# Patient Record
Sex: Male | Born: 1985 | Race: White | Hispanic: No | Marital: Single | Smoking: Light tobacco smoker
Health system: Southern US, Community
[De-identification: ages and names within clinical notes are randomized; demographics above are authoritative.]

## PROBLEM LIST (undated history)

## (undated) DIAGNOSIS — K5792 Diverticulitis of intestine, part unspecified, without perforation or abscess without bleeding: Secondary | ICD-10-CM

## (undated) DIAGNOSIS — H9319 Tinnitus, unspecified ear: Secondary | ICD-10-CM

## (undated) HISTORY — DX: Tinnitus, unspecified ear: H93.19

## (undated) HISTORY — DX: Diverticulitis of intestine, part unspecified, without perforation or abscess without bleeding: K57.92

---
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
## 2007-08-26 HISTORY — PX: HERNIA REPAIR: SHX51

## 2018-02-16 DIAGNOSIS — M7042 Prepatellar bursitis, left knee: Secondary | ICD-10-CM | POA: Insufficient documentation

## 2018-09-16 DIAGNOSIS — M545 Low back pain, unspecified: Secondary | ICD-10-CM | POA: Insufficient documentation

## 2019-02-28 ENCOUNTER — Other Ambulatory Visit: Payer: Self-pay

## 2019-02-28 ENCOUNTER — Ambulatory Visit: Payer: Self-pay

## 2019-02-28 DIAGNOSIS — Z021 Encounter for pre-employment examination: Secondary | ICD-10-CM

## 2019-03-08 ENCOUNTER — Encounter: Payer: Self-pay | Admitting: Internal Medicine

## 2019-03-16 ENCOUNTER — Encounter: Payer: Self-pay | Admitting: Internal Medicine

## 2019-03-16 ENCOUNTER — Ambulatory Visit: Payer: Self-pay | Admitting: Internal Medicine

## 2019-03-16 ENCOUNTER — Other Ambulatory Visit: Payer: Self-pay

## 2019-03-16 VITALS — BP 132/84 | HR 74 | Temp 98.8°F | Resp 16 | Ht 70.5 in | Wt 205.0 lb

## 2019-03-16 DIAGNOSIS — Z021 Encounter for pre-employment examination: Secondary | ICD-10-CM

## 2019-03-16 DIAGNOSIS — Z9889 Other specified postprocedural states: Secondary | ICD-10-CM | POA: Insufficient documentation

## 2019-03-16 DIAGNOSIS — Z0289 Encounter for other administrative examinations: Secondary | ICD-10-CM | POA: Insufficient documentation

## 2019-03-16 DIAGNOSIS — Z8719 Personal history of other diseases of the digestive system: Secondary | ICD-10-CM | POA: Insufficient documentation

## 2019-03-16 LAB — POCT URINALYSIS DIPSTICK
Bilirubin, UA: NEGATIVE
Blood, UA: NEGATIVE
Glucose, UA: NEGATIVE
Ketones, UA: NEGATIVE
Leukocytes, UA: NEGATIVE
Nitrite, UA: NEGATIVE
Protein, UA: NEGATIVE
Spec Grav, UA: >=1.03 — AB (ref 1.010–1.025)
Urobilinogen, UA: 0.2 E.U./dL
pH, UA: 6 (ref 5.0–8.0)

## 2019-03-16 NOTE — Progress Notes (Signed)
S  - presents for BLET evaluation  Noted two loose BM's this am, he thinks from hummus had recently, no other Covid concerning sx's with no fevers, and no loss of taste/smell included and no abdominal pains, no N/V  No other specific complaints, denies any recent CP, palpitations, SOB, dark/black stools, recent fevers, or other concerning sx's. Had bilat ing hernias in his past and repaired, no return of any sx's of concern  Exercise - more regular exercise in last two weeks, and has been active prior  Meds reviewed No current outpatient medications on file prior to visit.   No current facility-administered medications on file prior to visit.     \No Known Allergies   Social History   Tobacco Use  Smoking Status Light Tobacco Smoker  . Types: Cigarettes  Smokeless Tobacco Former Systems developer  . Types: Snuff     O - NAD, masked BP 132/84 (BP Location: Left Arm, Patient Position: Sitting, Cuff Size: Large)   Pulse 74   Temp 98.8 F (37.1 C) (Oral)   Resp 16   Ht 5' 10.5" (1.791 m)   Wt 205 lb (93 kg)   SpO2 96%   BMI 29.00 kg/m  HEENT - sclera anicteric, PERRL, EOMI, conj - non-inj'ed, TM's and canals clear Neck - supple, no adenopathy, no TM Car - RRR without m/g/r Pulm- CTA without wheeze or rales Abd - soft, NT, ND, no obvious HSM,  Back - no CVA tenderness Skin- + tattoo RUE Ext - no LE edema, no active joints GU - no swelling in inguinal/suprapubic region, NT, no ing hernia on exam Neuro - affect was not flat, appropriate with conversation  Grossly non-focal with good strength on testing, sensation intact to LT in distal extremities, Romberg neg, no pronator drift, good balance on one foot, good finger to nose, good RAMs  Labs reviewed - Urine dip neg, urine drug screen neg, other labs pending Hearing and vision screens reviewed - no concerns  A/P - Forms completed for BLET physical, no concerns noted  F/u Friday planned for TB screen reading of skin test  If diarrhea  increases or other sx's of concern arise as noted, especially if raise concern for Covid, should f/u

## 2019-03-17 LAB — CMP12+LP+TP+TSH+6AC+CBC/D/PLT
ALT: 37 IU/L (ref 0–44)
AST: 41 IU/L — ABNORMAL HIGH (ref 0–40)
Albumin/Globulin Ratio: 1.8 (ref 1.2–2.2)
Albumin: 4.3 g/dL (ref 4.0–5.0)
Alkaline Phosphatase: 68 IU/L (ref 39–117)
BUN/Creatinine Ratio: 10 (ref 9–20)
BUN: 12 mg/dL (ref 6–20)
Basophils Absolute: 0 10*3/uL (ref 0.0–0.2)
Basos: 1 %
Bilirubin Total: 0.3 mg/dL (ref 0.0–1.2)
Calcium: 9.5 mg/dL (ref 8.7–10.2)
Chloride: 100 mmol/L (ref 96–106)
Chol/HDL Ratio: 4.2 ratio (ref 0.0–5.0)
Cholesterol, Total: 193 mg/dL (ref 100–199)
Creatinine, Ser: 1.25 mg/dL (ref 0.76–1.27)
EOS (ABSOLUTE): 0.1 10*3/uL (ref 0.0–0.4)
Eos: 2 %
Estimated CHD Risk: 0.8 times avg. (ref 0.0–1.0)
Free Thyroxine Index: 2 (ref 1.2–4.9)
GFR calc Af Amer: 87 mL/min/{1.73_m2} (ref >59)
GFR calc non Af Amer: 75 mL/min/{1.73_m2} (ref >59)
GGT: 16 IU/L (ref 0–65)
Globulin, Total: 2.4 g/dL (ref 1.5–4.5)
Glucose: 85 mg/dL (ref 65–99)
HDL: 46 mg/dL (ref >39)
Hematocrit: 40.8 % (ref 37.5–51.0)
Hemoglobin: 13.9 g/dL (ref 13.0–17.7)
Immature Grans (Abs): 0 10*3/uL (ref 0.0–0.1)
Immature Granulocytes: 0 %
Iron: 78 ug/dL (ref 38–169)
LDH: 204 IU/L (ref 121–224)
LDL Calculated: 114 mg/dL — ABNORMAL HIGH (ref 0–99)
Lymphocytes Absolute: 1.7 10*3/uL (ref 0.7–3.1)
Lymphs: 35 %
MCH: 29.1 pg (ref 26.6–33.0)
MCHC: 34.1 g/dL (ref 31.5–35.7)
MCV: 86 fL (ref 79–97)
Monocytes Absolute: 0.5 10*3/uL (ref 0.1–0.9)
Monocytes: 10 %
Neutrophils Absolute: 2.5 10*3/uL (ref 1.4–7.0)
Neutrophils: 52 %
Phosphorus: 3.4 mg/dL (ref 2.8–4.1)
Platelets: 304 10*3/uL (ref 150–450)
Potassium: 4.1 mmol/L (ref 3.5–5.2)
RBC: 4.77 x10E6/uL (ref 4.14–5.80)
RDW: 12.9 % (ref 11.6–15.4)
Sodium: 140 mmol/L (ref 134–144)
T3 Uptake Ratio: 29 % (ref 24–39)
T4, Total: 6.9 ug/dL (ref 4.5–12.0)
TSH: 1.22 u[IU]/mL (ref 0.450–4.500)
Total Protein: 6.7 g/dL (ref 6.0–8.5)
Triglycerides: 167 mg/dL — ABNORMAL HIGH (ref 0–149)
Uric Acid: 5.7 mg/dL (ref 3.7–8.6)
VLDL Cholesterol Cal: 33 mg/dL (ref 5–40)
WBC: 4.8 10*3/uL (ref 3.4–10.8)

## 2019-03-17 LAB — HEPATITIS B SURFACE ANTIBODY,QUALITATIVE: Hep B Surface Ab, Qual: REACTIVE

## 2019-03-18 LAB — TB SKIN TEST
Induration: 0 mm
TB Skin Test: NEGATIVE

## 2019-08-04 DIAGNOSIS — Z20828 Contact with and (suspected) exposure to other viral communicable diseases: Secondary | ICD-10-CM | POA: Diagnosis not present

## 2020-01-17 ENCOUNTER — Other Ambulatory Visit: Payer: Self-pay

## 2020-01-17 ENCOUNTER — Ambulatory Visit: Payer: Self-pay

## 2020-01-17 DIAGNOSIS — Z0000 Encounter for general adult medical examination without abnormal findings: Secondary | ICD-10-CM

## 2020-01-17 LAB — POCT URINALYSIS DIPSTICK
Bilirubin, UA: NEGATIVE
Blood, UA: NEGATIVE
Glucose, UA: NEGATIVE
Ketones, UA: NEGATIVE
Leukocytes, UA: NEGATIVE
Nitrite, UA: NEGATIVE
Protein, UA: NEGATIVE
Spec Grav, UA: >=1.03 — AB (ref 1.010–1.025)
Urobilinogen, UA: NEGATIVE E.U./dL — AB
pH, UA: 6 (ref 5.0–8.0)

## 2020-01-18 LAB — CMP12+LP+TP+TSH+6AC+CBC/D/PLT
ALT: 26 IU/L (ref 0–44)
AST: 21 IU/L (ref 0–40)
Albumin/Globulin Ratio: 1.8 (ref 1.2–2.2)
Albumin: 4.4 g/dL (ref 4.0–5.0)
Alkaline Phosphatase: 76 IU/L (ref 48–121)
BUN/Creatinine Ratio: 14 (ref 9–20)
BUN: 15 mg/dL (ref 6–20)
Basophils Absolute: 0 10*3/uL (ref 0.0–0.2)
Basos: 1 %
Bilirubin Total: 0.2 mg/dL (ref 0.0–1.2)
Calcium: 9.6 mg/dL (ref 8.7–10.2)
Chloride: 102 mmol/L (ref 96–106)
Chol/HDL Ratio: 4.8 ratio (ref 0.0–5.0)
Cholesterol, Total: 223 mg/dL — ABNORMAL HIGH (ref 100–199)
Creatinine, Ser: 1.11 mg/dL (ref 0.76–1.27)
EOS (ABSOLUTE): 0.1 10*3/uL (ref 0.0–0.4)
Eos: 4 %
Estimated CHD Risk: 1 times avg. (ref 0.0–1.0)
Free Thyroxine Index: 1.4 (ref 1.2–4.9)
GFR calc Af Amer: 100 mL/min/{1.73_m2} (ref >59)
GFR calc non Af Amer: 86 mL/min/{1.73_m2} (ref >59)
GGT: 14 IU/L (ref 0–65)
Globulin, Total: 2.4 g/dL (ref 1.5–4.5)
Glucose: 81 mg/dL (ref 65–99)
HDL: 46 mg/dL (ref >39)
Hematocrit: 42.2 % (ref 37.5–51.0)
Hemoglobin: 14.7 g/dL (ref 13.0–17.7)
Immature Grans (Abs): 0 10*3/uL (ref 0.0–0.1)
Immature Granulocytes: 0 %
Iron: 40 ug/dL (ref 38–169)
LDH: 158 IU/L (ref 121–224)
LDL Chol Calc (NIH): 149 mg/dL — ABNORMAL HIGH (ref 0–99)
Lymphocytes Absolute: 1.3 10*3/uL (ref 0.7–3.1)
Lymphs: 40 %
MCH: 29.3 pg (ref 26.6–33.0)
MCHC: 34.8 g/dL (ref 31.5–35.7)
MCV: 84 fL (ref 79–97)
Monocytes Absolute: 0.3 10*3/uL (ref 0.1–0.9)
Monocytes: 10 %
Neutrophils Absolute: 1.5 10*3/uL (ref 1.4–7.0)
Neutrophils: 45 %
Phosphorus: 2.8 mg/dL (ref 2.8–4.1)
Platelets: 287 10*3/uL (ref 150–450)
Potassium: 4 mmol/L (ref 3.5–5.2)
RBC: 5.01 x10E6/uL (ref 4.14–5.80)
RDW: 13.4 % (ref 11.6–15.4)
Sodium: 139 mmol/L (ref 134–144)
T3 Uptake Ratio: 26 % (ref 24–39)
T4, Total: 5.5 ug/dL (ref 4.5–12.0)
TSH: 1.54 u[IU]/mL (ref 0.450–4.500)
Total Protein: 6.8 g/dL (ref 6.0–8.5)
Triglycerides: 154 mg/dL — ABNORMAL HIGH (ref 0–149)
Uric Acid: 4 mg/dL (ref 3.8–8.4)
VLDL Cholesterol Cal: 28 mg/dL (ref 5–40)
WBC: 3.2 10*3/uL — ABNORMAL LOW (ref 3.4–10.8)

## 2020-01-30 ENCOUNTER — Encounter: Payer: Self-pay | Admitting: Physician Assistant

## 2020-01-30 ENCOUNTER — Other Ambulatory Visit: Payer: Self-pay

## 2020-01-30 ENCOUNTER — Ambulatory Visit: Payer: Self-pay | Admitting: Physician Assistant

## 2020-01-30 VITALS — BP 146/88 | HR 81 | Temp 98.1°F | Resp 16 | Ht 71.0 in | Wt 197.8 lb

## 2020-01-30 DIAGNOSIS — Z0000 Encounter for general adult medical examination without abnormal findings: Secondary | ICD-10-CM

## 2020-01-30 NOTE — Progress Notes (Signed)
° °  Subjective: Physical exam    Patient ID: George Wong, male    DOB: Dec 26, 1985, 34 y.o.   MRN: [id number]  HPI Patient presents for annual physical exam.  Patient voices no concerns or complaints.   Review of Systems Negative    Objective:   Physical Exam No acute distress.  HEENT is unremarkable.  Neck is supple for adenopathy or bruits.  Lungs are clear to auscultation.  Heart regular rate and rhythm.  Abdomen with negative HSM, normoactive bowel sounds, soft, and nontender to palpation.  No obvious deformity to the upper or lower extremities.  Patient has full and equal range of motion of the upper and lower extremities.  No obvious deformity to the cervical lumbar spine.  Patient has full and equal range of motion of the cervical lumbar spine.  Cranial nerves II through XII are grossly intact.       Assessment & Plan: Well exam.  Discussed lab results with patient showing a cholesterol of 223, triglycerides 154, and LDL 149, and HDL of 46.  Discussed treatment options and patient Lasix to try lifestyle modification for 6 months.  Patient advised follow-up in 6 months with repeat labs.

## 2020-07-23 DIAGNOSIS — J069 Acute upper respiratory infection, unspecified: Secondary | ICD-10-CM | POA: Diagnosis not present

## 2020-07-23 DIAGNOSIS — Z03818 Encounter for observation for suspected exposure to other biological agents ruled out: Secondary | ICD-10-CM | POA: Diagnosis not present

## 2020-11-07 ENCOUNTER — Encounter: Payer: Self-pay | Admitting: Physician Assistant

## 2020-11-07 ENCOUNTER — Ambulatory Visit: Payer: Self-pay | Admitting: Physician Assistant

## 2020-11-07 ENCOUNTER — Other Ambulatory Visit: Payer: Self-pay

## 2020-11-07 VITALS — BP 132/88 | HR 77 | Temp 97.9°F | Resp 14 | Ht 71.0 in | Wt 195.0 lb

## 2020-11-07 DIAGNOSIS — Z4889 Encounter for other specified surgical aftercare: Secondary | ICD-10-CM | POA: Insufficient documentation

## 2020-11-07 DIAGNOSIS — R030 Elevated blood-pressure reading, without diagnosis of hypertension: Secondary | ICD-10-CM | POA: Insufficient documentation

## 2020-11-07 DIAGNOSIS — Z9889 Other specified postprocedural states: Secondary | ICD-10-CM | POA: Insufficient documentation

## 2020-11-07 DIAGNOSIS — F909 Attention-deficit hyperactivity disorder, unspecified type: Secondary | ICD-10-CM | POA: Insufficient documentation

## 2020-11-07 DIAGNOSIS — T148XXA Other injury of unspecified body region, initial encounter: Secondary | ICD-10-CM

## 2020-11-07 DIAGNOSIS — Z111 Encounter for screening for respiratory tuberculosis: Secondary | ICD-10-CM | POA: Insufficient documentation

## 2020-11-07 DIAGNOSIS — B360 Pityriasis versicolor: Secondary | ICD-10-CM | POA: Insufficient documentation

## 2020-11-07 DIAGNOSIS — H52229 Regular astigmatism, unspecified eye: Secondary | ICD-10-CM | POA: Insufficient documentation

## 2020-11-07 DIAGNOSIS — N469 Male infertility, unspecified: Secondary | ICD-10-CM | POA: Insufficient documentation

## 2020-11-07 DIAGNOSIS — F17200 Nicotine dependence, unspecified, uncomplicated: Secondary | ICD-10-CM | POA: Insufficient documentation

## 2020-11-07 DIAGNOSIS — H5210 Myopia, unspecified eye: Secondary | ICD-10-CM | POA: Insufficient documentation

## 2020-11-07 DIAGNOSIS — N4611 Organic oligospermia: Secondary | ICD-10-CM | POA: Insufficient documentation

## 2020-11-07 DIAGNOSIS — E291 Testicular hypofunction: Secondary | ICD-10-CM | POA: Insufficient documentation

## 2020-11-07 DIAGNOSIS — M2240 Chondromalacia patellae, unspecified knee: Secondary | ICD-10-CM | POA: Insufficient documentation

## 2020-11-07 DIAGNOSIS — K649 Unspecified hemorrhoids: Secondary | ICD-10-CM | POA: Insufficient documentation

## 2020-11-07 DIAGNOSIS — B354 Tinea corporis: Secondary | ICD-10-CM | POA: Insufficient documentation

## 2020-11-07 DIAGNOSIS — Z23 Encounter for immunization: Secondary | ICD-10-CM | POA: Insufficient documentation

## 2020-11-07 DIAGNOSIS — S93402A Sprain of unspecified ligament of left ankle, initial encounter: Secondary | ICD-10-CM | POA: Insufficient documentation
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
## 2020-11-14 ENCOUNTER — Other Ambulatory Visit: Payer: Self-pay

## 2020-11-14 ENCOUNTER — Emergency Department
Admission: EM | Admit: 2020-11-14 | Discharge: 2020-11-14 | Disposition: A | Discharge status: Home / Self Care | Payer: No Typology Code available for payment source | Attending: Emergency Medicine | Admitting: Emergency Medicine

## 2020-11-14 DIAGNOSIS — F17210 Nicotine dependence, cigarettes, uncomplicated: Secondary | ICD-10-CM | POA: Insufficient documentation

## 2020-11-14 DIAGNOSIS — F419 Anxiety disorder, unspecified: Secondary | ICD-10-CM | POA: Diagnosis present

## 2020-11-14 DIAGNOSIS — R457 State of emotional shock and stress, unspecified: Secondary | ICD-10-CM | POA: Diagnosis not present
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
## 2020-11-14 NOTE — Progress Notes (Addendum)
Lab corp specimen [id number]
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
## 2020-11-15 ENCOUNTER — Ambulatory Visit: Payer: Self-pay | Admitting: Nurse Practitioner

## 2020-11-15 ENCOUNTER — Encounter: Payer: Self-pay | Admitting: Nurse Practitioner

## 2020-11-15 VITALS — BP 120/87 | HR 71 | Temp 98.2°F | Resp 12

## 2020-11-15 DIAGNOSIS — Z09 Encounter for follow-up examination after completed treatment for conditions other than malignant neoplasm: Secondary | ICD-10-CM

## 2020-11-15 DIAGNOSIS — Z0130 Encounter for examination of blood pressure without abnormal findings: Secondary | ICD-10-CM
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
## 2020-11-15 NOTE — Progress Notes (Signed)
Last vaped today around 1:30 pm.

## 2020-11-15 NOTE — Patient Instructions (Addendum)
Contusion A contusion is a deep bruise. This is a result of an injury that causes bleeding under the skin. Symptoms of bruising include pain, swelling, and discolored skin. The skin may turn blue, purple, or yellow. Follow these instructions at home: Managing pain, stiffness, and swelling You may use RICE. This stands for:  Resting.  Icing.  Compression, or putting pressure.  Elevating, or raising the injured area. To follow this method, do these actions:  Rest the injured area.  If told, put ice on the injured area. ? Put ice in a plastic bag. ? Place a towel between your skin and the bag. ? Leave the ice on for 20 minutes, 2-3 times per day.  If told, put light pressure (compression) on the injured area using an elastic bandage. Make sure the bandage is not too tight. If the area tingles or becomes numb, remove it and put it back on as told by your doctor.  If possible, raise (elevate) the injured area above the level of your heart while you are sitting or lying down.   General instructions  Take over-the-counter and prescription medicines only as told by your doctor.  Keep all follow-up visits as told by your doctor. This is important. Contact a doctor if:  Your symptoms do not get better after several days of treatment.  Your symptoms get worse.  You have trouble moving the injured area. Get help right away if:  You have very bad pain.  You have a loss of feeling (numbness) in a hand or foot.  Your hand or foot turns pale or cold. Summary  A contusion is a deep bruise. This is a result of an injury that causes bleeding under the skin.  Symptoms of bruising include pain, swelling, and discolored skin. The skin may turn blue, purple, or yellow.  This condition is treated with rest, ice, compression, and elevation. This is also called RICE. You may be given over-the-counter medicines for pain.  Contact a doctor if you do not feel better, or you feel worse. Get  help right away if you have very bad pain, have lost feeling in a hand or foot, or the area turns pale or cold. This information is not intended to replace advice given to you by your health care provider. Make sure you discuss any questions you have with your health care provider. Document Revised: 04/02/2018 Document Reviewed: 04/02/2018 Elsevier Patient Education  2021 Elsevier Inc.  [email or web address]">  Panic Attack A panic attack is when you suddenly feel very afraid, uncomfortable, or nervous (anxious). A panic attack can happen when you are scared or for no reason. A panic attack can feel like a serious problem. It can even feel like a heart attack or stroke. See your doctor when you have a panic attack to make sure you do not have a serious problem. Follow these instructions at home:  Take medicines only as told by your doctor.  If you feel worried or nervous, try not to have caffeine.  Take good care of your health. To do this: ? Eat healthy. Make sure to eat fresh fruits and vegetables, whole grains, lean meats, and low-fat dairy. ? Get enough sleep. Try to sleep for 7-8 hours each night. ? Exercise. Try to be active for 30 minutes 5 or more days a week. ? Do not smoke. Talk to your doctor if you need help quitting. ? Limit how much alcohol you drink:  If you are a woman who is not pregnant:  try not to have more than 1 drink a day.  If you are a man: try not to have more than 2 drinks a day.  One drink equals 12 oz of beer, 5 oz of wine, or 1 oz of hard liquor.  Keep all follow-up visits as told by your doctor. This is important.   Contact a doctor if:  Your symptoms do not get better.  Your symptoms get worse.  You are not able to take your medicines as told. Get help right away if:  You have thoughts of hurting yourself or others.  You have symptoms of a panic attack. Do not drive yourself to the hospital. Have  someone else drive you or call an ambulance. If you feel like you may hurt yourself or others, or have thoughts about taking your own life, get help right away. You can go to your nearest emergency department or call:  Your local emergency services (911 in the [place]  A suicide crisis helpline, such as the National Suicide Prevention Lifeline at 860-856-2560. This is open 24 hours a day. Summary  A panic attack is when you suddenly feel very afraid, uncomfortable, or nervous (anxious).  See your doctor when you have a panic attack to make sure that you do not have another serious problem.  If you feel like you may hurt yourself or others, get help right away by calling 911. This information is not intended to replace advice given to you by your health care provider. Make sure you discuss any questions you have with your health care provider. Document Revised: 02/09/2020 Document Reviewed: 02/09/2020 Elsevier Patient Education  2021 [employer].

## 2020-12-13 ENCOUNTER — Ambulatory Visit: Payer: Self-pay

## 2020-12-13 ENCOUNTER — Other Ambulatory Visit: Payer: Self-pay

## 2020-12-13 DIAGNOSIS — Z01818 Encounter for other preprocedural examination: Secondary | ICD-10-CM

## 2020-12-13 LAB — POCT URINALYSIS DIPSTICK
Bilirubin, UA: NEGATIVE
Blood, UA: NEGATIVE
Glucose, UA: NEGATIVE
Ketones, UA: NEGATIVE
Leukocytes, UA: NEGATIVE
Nitrite, UA: NEGATIVE
Protein, UA: NEGATIVE
Spec Grav, UA: >=1.03 — AB (ref 1.010–1.025)
Urobilinogen, UA: 0.2 E.U./dL
pH, UA: 6 (ref 5.0–8.0)

## 2020-12-13 NOTE — Progress Notes (Signed)
Scheduled to complete physical 12/21/20 with Adam Scarboro, NP.  AMD 

## 2020-12-14 LAB — CMP12+LP+TP+TSH+6AC+CBC/D/PLT
ALT: 20 IU/L (ref 0–44)
AST: 19 IU/L (ref 0–40)
Albumin/Globulin Ratio: 1.7 (ref 1.2–2.2)
Albumin: 4.2 g/dL (ref 4.0–5.0)
Alkaline Phosphatase: 76 IU/L (ref 44–121)
BUN/Creatinine Ratio: 16 (ref 9–20)
BUN: 18 mg/dL (ref 6–20)
Basophils Absolute: 0 10*3/uL (ref 0.0–0.2)
Basos: 1 %
Bilirubin Total: 0.3 mg/dL (ref 0.0–1.2)
Calcium: 9.3 mg/dL (ref 8.7–10.2)
Chloride: 104 mmol/L (ref 96–106)
Chol/HDL Ratio: 4.7 ratio (ref 0.0–5.0)
Cholesterol, Total: 211 mg/dL — ABNORMAL HIGH (ref 100–199)
Creatinine, Ser: 1.16 mg/dL (ref 0.76–1.27)
EOS (ABSOLUTE): 0.1 10*3/uL (ref 0.0–0.4)
Eos: 3 %
Estimated CHD Risk: 0.9 times avg. (ref 0.0–1.0)
Free Thyroxine Index: 1.8 (ref 1.2–4.9)
GGT: 17 IU/L (ref 0–65)
Globulin, Total: 2.5 g/dL (ref 1.5–4.5)
Glucose: 91 mg/dL (ref 65–99)
HDL: 45 mg/dL (ref >39)
Hematocrit: 37.9 % (ref 37.5–51.0)
Hemoglobin: 12.8 g/dL — ABNORMAL LOW (ref 13.0–17.7)
Immature Grans (Abs): 0 10*3/uL (ref 0.0–0.1)
Immature Granulocytes: 0 %
Iron: 47 ug/dL (ref 38–169)
LDH: 158 IU/L (ref 121–224)
LDL Chol Calc (NIH): 145 mg/dL — ABNORMAL HIGH (ref 0–99)
Lymphocytes Absolute: 1.4 10*3/uL (ref 0.7–3.1)
Lymphs: 38 %
MCH: 26.7 pg (ref 26.6–33.0)
MCHC: 33.8 g/dL (ref 31.5–35.7)
MCV: 79 fL (ref 79–97)
Monocytes Absolute: 0.4 10*3/uL (ref 0.1–0.9)
Monocytes: 11 %
Neutrophils Absolute: 1.7 10*3/uL (ref 1.4–7.0)
Neutrophils: 47 %
Phosphorus: 3.3 mg/dL (ref 2.8–4.1)
Platelets: 346 10*3/uL (ref 150–450)
Potassium: 4.3 mmol/L (ref 3.5–5.2)
RBC: 4.8 x10E6/uL (ref 4.14–5.80)
RDW: 14 % (ref 11.6–15.4)
Sodium: 142 mmol/L (ref 134–144)
T3 Uptake Ratio: 27 % (ref 24–39)
T4, Total: 6.6 ug/dL (ref 4.5–12.0)
TSH: 0.88 u[IU]/mL (ref 0.450–4.500)
Total Protein: 6.7 g/dL (ref 6.0–8.5)
Triglycerides: 114 mg/dL (ref 0–149)
Uric Acid: 5 mg/dL (ref 3.8–8.4)
VLDL Cholesterol Cal: 21 mg/dL (ref 5–40)
WBC: 3.6 10*3/uL (ref 3.4–10.8)
eGFR: 84 mL/min/{1.73_m2} (ref >59)

## 2020-12-21 ENCOUNTER — Ambulatory Visit: Payer: Self-pay | Admitting: Adult Health

## 2020-12-21 ENCOUNTER — Other Ambulatory Visit: Payer: Self-pay

## 2020-12-21 VITALS — BP 129/96 | HR 65 | Temp 97.6°F | Resp 14 | Ht 71.0 in | Wt 205.0 lb

## 2020-12-21 DIAGNOSIS — Z0000 Encounter for general adult medical examination without abnormal findings: Secondary | ICD-10-CM

## 2020-12-21 DIAGNOSIS — E782 Mixed hyperlipidemia: Secondary | ICD-10-CM
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
## 2021-08-05 DIAGNOSIS — R41840 Attention and concentration deficit: Secondary | ICD-10-CM | POA: Diagnosis not present

## 2021-08-07 ENCOUNTER — Other Ambulatory Visit: Payer: Self-pay

## 2021-08-07 ENCOUNTER — Ambulatory Visit
Admission: RE | Admit: 2021-08-07 | Discharge: 2021-08-07 | Disposition: A | Discharge status: Home / Self Care | Payer: No Typology Code available for payment source | Source: Ambulatory Visit | Attending: Physician Assistant | Admitting: Physician Assistant

## 2021-08-07 ENCOUNTER — Encounter: Payer: Self-pay | Admitting: Physician Assistant

## 2021-08-07 ENCOUNTER — Ambulatory Visit
Admission: RE | Admit: 2021-08-07 | Discharge: 2021-08-07 | Disposition: A | Discharge status: Home / Self Care | Payer: No Typology Code available for payment source | Attending: Physician Assistant | Admitting: Physician Assistant

## 2021-08-07 ENCOUNTER — Ambulatory Visit: Payer: Self-pay | Admitting: Physician Assistant

## 2021-08-07 VITALS — BP 136/93 | HR 100 | Temp 98.4°F | Resp 14 | Ht 71.0 in | Wt 204.0 lb

## 2021-08-07 DIAGNOSIS — S8011XA Contusion of right lower leg, initial encounter: Secondary | ICD-10-CM | POA: Diagnosis present

## 2021-08-07 DIAGNOSIS — S8001XA Contusion of right knee, initial encounter: Secondary | ICD-10-CM | POA: Insufficient documentation
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
## 2021-08-08 ENCOUNTER — Ambulatory Visit: Payer: Self-pay | Admitting: Physician Assistant

## 2021-08-08 ENCOUNTER — Encounter: Payer: Self-pay | Admitting: Physician Assistant

## 2021-08-08 VITALS — BP 132/84 | HR 88 | Temp 99.1°F | Resp 14 | Ht 71.0 in | Wt 204.0 lb

## 2021-08-08 DIAGNOSIS — S8001XD Contusion of right knee, subsequent encounter: Secondary | ICD-10-CM

## 2021-08-08 NOTE — Progress Notes (Signed)
Pt presents today for follow up WC, right Knee injury.

## 2021-08-08 NOTE — Progress Notes (Signed)
° °  Subjective: Right knee pain    Patient ID: George Wong, male    DOB: 04-18-1986, 35 y.o.   MRN: [id number]  HPI Patient follow-up for right knee pain secondary to contusion caused by fall on 08/04/2021.   Review of Systems ADHD    Objective:   Physical Exam No acute distress.  Temperature 99.1, pulse 80 rate respiration 14, BP is 132/84, patient 98% O2 sat on room air.  Patient weighs 204 pounds BMI is 28.5. Patient ambulates with normal gait.  No obvious deformity of the right knee.  Patient has full and equal range of motion.  No laxity with stress testing.  Definitive x-ray report is not available but no obvious abnormalities on the films.      Assessment & Plan: Right knee pain secondary to contusion.   Patient return back to modified duty for the next 2 days.  Advise over-the-counter anti-inflammatory medications.  Return to the clinic if no improvement or worsening complaint.

## 2021-09-23 ENCOUNTER — Ambulatory Visit: Payer: Self-pay | Admitting: Physician Assistant

## 2021-09-23 ENCOUNTER — Encounter: Payer: Self-pay | Admitting: Physician Assistant

## 2021-09-23 ENCOUNTER — Other Ambulatory Visit: Payer: Self-pay

## 2021-09-23 VITALS — BP 133/71 | HR 97 | Temp 98.4°F | Resp 14 | Ht 71.0 in | Wt 200.0 lb

## 2021-09-23 DIAGNOSIS — S76111A Strain of right quadriceps muscle, fascia and tendon, initial encounter: Secondary | ICD-10-CM

## 2021-09-23 MED ORDER — NAPROXEN 500 MG PO TABS: 500.0000 mg | ORAL_TABLET | Freq: Two times a day (BID) | ORAL | 20 refills | Status: DC

## 2021-09-23 NOTE — Progress Notes (Signed)
° °  Subjective: Right leg pain    Patient ID: George Wong, male    DOB: 1985-09-24, 36 y.o.   MRN: [id number]  HPI Patient complaining of right upper leg pain secondary to a lunging incident while doing physical training today.  Patient described pain as "sharp".  States mild relief using ice application.  Patient is able to bear weight.  Rates pain as a 5/10.   Review of Systems Chondromalacia of the patella    Objective:   Physical Exam No acute distress.  See nurses note for vital signs. No obvious deformity, no edema, erythema, or ecchymosis.  Patient has moderate guarding palpation of superior aspect of the right quadricep.  Patient has full and equal range of motion of the right lower extremity.       Assessment & Plan: Muscle strain  Patient given discharge care instruction advised alternating heat and ice application.  Patient given over-the-counter naproxen.  Patient placed on light duty and advised return back in 2 days for reevaluation.

## 2021-09-23 NOTE — Progress Notes (Signed)
Pt feels like he's pulled a quad muscle in right leg doing lunges. Burna Sis

## 2021-09-24 DIAGNOSIS — H5213 Myopia, bilateral: Secondary | ICD-10-CM | POA: Diagnosis not present

## 2021-09-26 ENCOUNTER — Other Ambulatory Visit: Payer: Self-pay

## 2021-09-26 ENCOUNTER — Encounter: Payer: Self-pay | Admitting: Physician Assistant

## 2021-09-26 ENCOUNTER — Ambulatory Visit: Payer: Self-pay | Admitting: Physician Assistant

## 2021-09-26 VITALS — BP 143/97 | HR 101 | Temp 99.0°F | Resp 14 | Ht 71.0 in | Wt 203.0 lb

## 2021-09-26 DIAGNOSIS — S76111S Strain of right quadriceps muscle, fascia and tendon, sequela: Secondary | ICD-10-CM

## 2021-09-26 MED ORDER — ORPHENADRINE CITRATE ER 100 MG PO TB12: 100.0000 mg | ORAL_TABLET | Freq: Every evening | ORAL | 0 refills | Status: DC

## 2021-09-26 NOTE — Progress Notes (Signed)
Pt presents today for follow up up on quad muscle injury. Pt states he's doing better.

## 2021-09-26 NOTE — Progress Notes (Signed)
° °  Subjective: Right leg muscle strain    Patient ID: George Wong, male    DOB: [birth year], 36 y.o.   MRN: [id number]  HPI Patient follow-up 3 days status post muscle strain to the right upper anterior leg while doing lunging exercises.  Patient state following discharge care instruction alternate heat and ice and taking over-the-counter NSAIDs with remarkable relief.  Patient states there is still a "twinge" of pain with prolonged squatting.  Patient requests return to full duty.   Review of Systems Negative except for complaint     Objective:   Physical Exam No acute distress.  Normal gait. Temperature 99, pulse 101, BP is 143/97, patient weighs 203 pounds and BMI is 28.31. No obvious deformity to the right leg.  No guarding with palpation.  Patient has full Nikkel range of motion.  Strength against resistance 5/5.       Assessment & Plan: Muscle strain right leg  Patient advised to take muscle relaxer before going to sleep for the next 5 days.  Follow-up as necessary.

## 2021-10-22 ENCOUNTER — Ambulatory Visit: Payer: Self-pay | Admitting: Physician Assistant

## 2021-10-22 ENCOUNTER — Other Ambulatory Visit: Payer: Self-pay

## 2021-10-22 ENCOUNTER — Encounter: Payer: Self-pay | Admitting: Physician Assistant

## 2021-10-22 ENCOUNTER — Ambulatory Visit
Admission: RE | Admit: 2021-10-22 | Discharge: 2021-10-22 | Disposition: A | Discharge status: Home / Self Care | Payer: 59 | Attending: Physician Assistant | Admitting: Physician Assistant

## 2021-10-22 ENCOUNTER — Ambulatory Visit
Admission: RE | Admit: 2021-10-22 | Discharge: 2021-10-22 | Disposition: A | Discharge status: Home / Self Care | Payer: 59 | Source: Ambulatory Visit | Attending: Physician Assistant | Admitting: Physician Assistant

## 2021-10-22 VITALS — BP 133/81 | HR 93 | Temp 97.7°F | Resp 14 | Ht 71.0 in | Wt 202.0 lb

## 2021-10-22 DIAGNOSIS — M25512 Pain in left shoulder: Secondary | ICD-10-CM

## 2021-10-22 MED ORDER — IBUPROFEN 800 MG PO TABS: 800.0000 mg | ORAL_TABLET | Freq: Three times a day (TID) | ORAL | 0 refills | Status: DC | PRN

## 2021-10-22 MED ORDER — METHOCARBAMOL 750 MG PO TABS
750.0000 mg | ORAL_TABLET | Freq: Four times a day (QID) | ORAL | 0 refills | Status: DC
Start: 2021-10-22 — End: 2023-11-11

## 2021-10-22 NOTE — Progress Notes (Signed)
° °  Subjective: Left shoulder pain    Patient ID: George Wong, male    DOB: Feb 11, 1986, 36 y.o.   MRN: [id number]  HPI Patient complains of left shoulder pain secondary to weightlifting incident which occurred on 10/16/2021.  Patient states he has decreased range of motion with abduction and overhead reaching.  Patient also states decreased strength in comparison to the right upper extremity.   Review of Systems Negative septa chief complaint.    Objective:   Physical Exam  No acute distress.  Patient is right-hand dominant.  Temperature is 97.7, pulse 93, respiration 14, BP is 133/81, and patient is 97% O2 sat on room air.  Patient weighs 202 pounds and BMI is 28.17. No obvious deformity to the left shoulder.  Patient has decreased decreased range of motion with abduction and overhead reach.  Initial findings on x-ray suspicious for mild AC strain.  Will await definitive x-ray report from radiology.    Assessment & Plan: Left shoulder pain.   Patient placed on limited duty for the next 5 days.  Patient given prescription for Robaxin and ibuprofen.  Will advise telephonically of x-ray results or any change in treatment plan.

## 2021-10-22 NOTE — Progress Notes (Signed)
Pt presents today for xray results for Left shoulder, pt hurt it last Wednesday at gym.

## 2021-10-28 ENCOUNTER — Encounter: Payer: Self-pay | Admitting: Physician Assistant

## 2021-10-28 ENCOUNTER — Ambulatory Visit: Payer: Self-pay | Admitting: Physician Assistant

## 2021-10-28 ENCOUNTER — Other Ambulatory Visit: Payer: Self-pay

## 2021-10-28 VITALS — BP 134/88 | HR 72 | Temp 98.0°F | Resp 14 | Ht 71.0 in | Wt 202.0 lb

## 2021-10-28 DIAGNOSIS — M25512 Pain in left shoulder: Secondary | ICD-10-CM

## 2021-10-28 DIAGNOSIS — S46911A Strain of unspecified muscle, fascia and tendon at shoulder and upper arm level, right arm, initial encounter: Secondary | ICD-10-CM | POA: Diagnosis not present

## 2021-10-28 NOTE — Progress Notes (Signed)
? ?  Subjective: Left shoulder pain  ? ? Patient ID: George Wong, male    DOB: November 11, 1985, 36 y.o.   MRN: [id number] ? ?HPI ?Patient here today to follow-up for left shoulder pain secondary to lifting incident which occurred on 10/16/2021.  Patient has decreased range of motion with adduction and overhead reaching.  Patient refractory to conservative treatment consist of rest, anti-inflammatory, and muscle relaxants.  Patient left shoulder x-ray was unremarkable. ? ? ?Review of Systems ?Negative except for chief complaint. ?   ?Objective:  ? Physical Exam ?No acute distress.  Temperature is 98, pulse 72, respiration 14, BP is 134/88, patient 1% O2 sat on room air.  Patient weighs 202 pounds and BMI is 28.17. ?No obvious deformity to the left shoulder.  No edema or erythema.  Patient has decreased range of motion with abduction and overhead reaching.  Strength against resistance is 3/5 in comparison to the right shoulder.  Neurovascular intact. ? ? ? ?   ?Assessment & Plan: Left shoulder pain  ?Patient consulted to [facility] for definitive evaluation and treatment. ? ?

## 2021-10-28 NOTE — Progress Notes (Signed)
Pt presents today with lft shoulder pain follow up. Pt states its still bothers him the change is more or less. ?

## 2021-10-31 DIAGNOSIS — M25512 Pain in left shoulder: Secondary | ICD-10-CM | POA: Diagnosis not present

## 2021-10-31 DIAGNOSIS — M25612 Stiffness of left shoulder, not elsewhere classified: Secondary | ICD-10-CM | POA: Diagnosis not present

## 2021-10-31 DIAGNOSIS — M6281 Muscle weakness (generalized): Secondary | ICD-10-CM | POA: Diagnosis not present

## 2021-11-06 DIAGNOSIS — M25612 Stiffness of left shoulder, not elsewhere classified: Secondary | ICD-10-CM | POA: Diagnosis not present

## 2021-11-06 DIAGNOSIS — M6281 Muscle weakness (generalized): Secondary | ICD-10-CM | POA: Diagnosis not present

## 2021-11-06 DIAGNOSIS — M25512 Pain in left shoulder: Secondary | ICD-10-CM | POA: Diagnosis not present

## 2021-11-08 DIAGNOSIS — M25612 Stiffness of left shoulder, not elsewhere classified: Secondary | ICD-10-CM | POA: Diagnosis not present

## 2021-11-08 DIAGNOSIS — M6281 Muscle weakness (generalized): Secondary | ICD-10-CM | POA: Diagnosis not present

## 2021-11-08 DIAGNOSIS — M25512 Pain in left shoulder: Secondary | ICD-10-CM | POA: Diagnosis not present

## 2021-11-12 DIAGNOSIS — M25612 Stiffness of left shoulder, not elsewhere classified: Secondary | ICD-10-CM | POA: Diagnosis not present

## 2021-11-12 DIAGNOSIS — M6281 Muscle weakness (generalized): Secondary | ICD-10-CM | POA: Diagnosis not present

## 2021-11-12 DIAGNOSIS — M25512 Pain in left shoulder: Secondary | ICD-10-CM | POA: Diagnosis not present

## 2021-11-14 DIAGNOSIS — M25512 Pain in left shoulder: Secondary | ICD-10-CM | POA: Diagnosis not present

## 2021-11-14 DIAGNOSIS — M6281 Muscle weakness (generalized): Secondary | ICD-10-CM | POA: Diagnosis not present

## 2021-11-14 DIAGNOSIS — M25612 Stiffness of left shoulder, not elsewhere classified: Secondary | ICD-10-CM | POA: Diagnosis not present

## 2021-11-18 DIAGNOSIS — M25512 Pain in left shoulder: Secondary | ICD-10-CM | POA: Diagnosis not present

## 2021-11-19 DIAGNOSIS — M6281 Muscle weakness (generalized): Secondary | ICD-10-CM | POA: Diagnosis not present

## 2021-11-19 DIAGNOSIS — M25512 Pain in left shoulder: Secondary | ICD-10-CM | POA: Diagnosis not present

## 2021-11-19 DIAGNOSIS — M25612 Stiffness of left shoulder, not elsewhere classified: Secondary | ICD-10-CM | POA: Diagnosis not present

## 2021-11-22 DIAGNOSIS — M25512 Pain in left shoulder: Secondary | ICD-10-CM | POA: Diagnosis not present

## 2021-11-22 DIAGNOSIS — M25612 Stiffness of left shoulder, not elsewhere classified: Secondary | ICD-10-CM | POA: Diagnosis not present

## 2021-11-22 DIAGNOSIS — M6281 Muscle weakness (generalized): Secondary | ICD-10-CM | POA: Diagnosis not present

## 2021-11-24 DIAGNOSIS — M249 Joint derangement, unspecified: Secondary | ICD-10-CM | POA: Insufficient documentation

## 2021-11-26 DIAGNOSIS — M25512 Pain in left shoulder: Secondary | ICD-10-CM | POA: Diagnosis not present

## 2021-11-26 DIAGNOSIS — M25612 Stiffness of left shoulder, not elsewhere classified: Secondary | ICD-10-CM | POA: Diagnosis not present

## 2021-11-26 DIAGNOSIS — M6281 Muscle weakness (generalized): Secondary | ICD-10-CM | POA: Diagnosis not present

## 2021-11-27 DIAGNOSIS — M25612 Stiffness of left shoulder, not elsewhere classified: Secondary | ICD-10-CM | POA: Diagnosis not present

## 2021-11-27 DIAGNOSIS — M25512 Pain in left shoulder: Secondary | ICD-10-CM | POA: Diagnosis not present

## 2021-11-27 DIAGNOSIS — M6281 Muscle weakness (generalized): Secondary | ICD-10-CM | POA: Diagnosis not present

## 2021-12-02 DIAGNOSIS — M25512 Pain in left shoulder: Secondary | ICD-10-CM | POA: Diagnosis not present

## 2021-12-23 DIAGNOSIS — M25512 Pain in left shoulder: Secondary | ICD-10-CM | POA: Insufficient documentation

## 2021-12-23 DIAGNOSIS — S43439A Superior glenoid labrum lesion of unspecified shoulder, initial encounter: Secondary | ICD-10-CM | POA: Insufficient documentation

## 2021-12-23 DIAGNOSIS — S43432A Superior glenoid labrum lesion of left shoulder, initial encounter: Secondary | ICD-10-CM | POA: Diagnosis not present

## 2021-12-27 ENCOUNTER — Ambulatory Visit: Payer: Self-pay

## 2021-12-27 DIAGNOSIS — Z0000 Encounter for general adult medical examination without abnormal findings: Secondary | ICD-10-CM

## 2021-12-27 LAB — POCT URINALYSIS DIPSTICK
Bilirubin, UA: NEGATIVE
Blood, UA: NEGATIVE
Glucose, UA: NEGATIVE
Ketones, UA: NEGATIVE
Leukocytes, UA: NEGATIVE
Nitrite, UA: NEGATIVE
Protein, UA: NEGATIVE
Spec Grav, UA: >=1.03 — AB (ref 1.010–1.025)
Urobilinogen, UA: 0.2 E.U./dL
pH, UA: 6 (ref 5.0–8.0)

## 2021-12-27 NOTE — Progress Notes (Signed)
Pt presents today for physical labs, will return to clinic for scheduled physical.  

## 2022-01-01 ENCOUNTER — Encounter: Payer: Self-pay | Admitting: Physician Assistant

## 2022-01-03 DIAGNOSIS — M24012 Loose body in left shoulder: Secondary | ICD-10-CM | POA: Diagnosis not present

## 2022-01-03 DIAGNOSIS — M65812 Other synovitis and tenosynovitis, left shoulder: Secondary | ICD-10-CM | POA: Diagnosis not present

## 2022-01-03 DIAGNOSIS — M25512 Pain in left shoulder: Secondary | ICD-10-CM | POA: Diagnosis not present

## 2022-01-03 DIAGNOSIS — S43492A Other sprain of left shoulder joint, initial encounter: Secondary | ICD-10-CM | POA: Diagnosis not present

## 2022-01-03 DIAGNOSIS — G8918 Other acute postprocedural pain: Secondary | ICD-10-CM | POA: Diagnosis not present

## 2022-01-03 DIAGNOSIS — M94212 Chondromalacia, left shoulder: Secondary | ICD-10-CM | POA: Diagnosis not present

## 2022-01-03 DIAGNOSIS — S43432A Superior glenoid labrum lesion of left shoulder, initial encounter: Secondary | ICD-10-CM | POA: Diagnosis not present

## 2022-01-03 HISTORY — PX: SHOULDER ARTHROSCOPY WITH LABRAL REPAIR: SHX5691

## 2022-01-06 DIAGNOSIS — M25612 Stiffness of left shoulder, not elsewhere classified: Secondary | ICD-10-CM | POA: Diagnosis not present

## 2022-01-06 DIAGNOSIS — M6281 Muscle weakness (generalized): Secondary | ICD-10-CM | POA: Diagnosis not present

## 2022-01-06 DIAGNOSIS — M25512 Pain in left shoulder: Secondary | ICD-10-CM | POA: Diagnosis not present

## 2022-01-07 ENCOUNTER — Encounter: Payer: Self-pay | Admitting: Physician Assistant

## 2022-01-09 DIAGNOSIS — M25612 Stiffness of left shoulder, not elsewhere classified: Secondary | ICD-10-CM | POA: Diagnosis not present

## 2022-01-09 DIAGNOSIS — M25512 Pain in left shoulder: Secondary | ICD-10-CM | POA: Diagnosis not present

## 2022-01-09 DIAGNOSIS — M6281 Muscle weakness (generalized): Secondary | ICD-10-CM | POA: Diagnosis not present

## 2022-01-14 ENCOUNTER — Encounter: Payer: Self-pay | Admitting: Physician Assistant

## 2022-01-14 ENCOUNTER — Ambulatory Visit: Payer: Self-pay | Admitting: Physician Assistant

## 2022-01-14 VITALS — BP 139/87 | HR 88 | Temp 97.7°F | Resp 14 | Ht 71.0 in | Wt 185.0 lb

## 2022-01-14 DIAGNOSIS — M25612 Stiffness of left shoulder, not elsewhere classified: Secondary | ICD-10-CM | POA: Diagnosis not present

## 2022-01-14 DIAGNOSIS — Z0000 Encounter for general adult medical examination without abnormal findings: Secondary | ICD-10-CM

## 2022-01-14 DIAGNOSIS — M6281 Muscle weakness (generalized): Secondary | ICD-10-CM | POA: Diagnosis not present

## 2022-01-14 DIAGNOSIS — M25512 Pain in left shoulder: Secondary | ICD-10-CM | POA: Diagnosis not present

## 2022-01-14 NOTE — Progress Notes (Signed)
[facility]  ____________________________________________   None    (approximate)  I have reviewed the triage vital signs and the nursing notes.   HISTORY  Chief Complaint Annual Exam   HPI George Wong is a 36 y.o. male patient presents annual physical exam.  Patient is status 2 weeks post left shoulder surgery for a glenoid labrum tear.  Voices no other concerns or complaints.         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   Myopia 11/07/2020   Need for prophylactic vaccination with typhoid-paratyphoid alone (TAB) 11/07/2020   Oligospermia 11/07/2020   Other aftercare following surgery 11/07/2020   Other testicular hypofunction 11/07/2020   Pityriasis versicolor 11/07/2020   Regular astigmatism 11/07/2020   Screening examination for pulmonary tuberculosis 11/07/2020   Sprain of left ankle 11/07/2020   Tobacco use disorder 11/07/2020   Health examination of defined subpopulation 03/16/2019   History of bilateral inguinal hernia repair 03/16/2019   Low back pain 09/16/2018   Prepatellar bursitis, left knee 02/16/2018    Past Surgical History:  Procedure Laterality Date   HERNIA REPAIR Right 2009   INGUINAL HERNIA REPAIR Left 2003   WISDOM TOOTH EXTRACTION Bilateral 2005   All four removed    Prior to Admission medications   Medication Sig Start Date End Date Taking? Authorizing Provider  ibuprofen  (ADVIL) 800 MG tablet Take 1 tablet (800 mg total) by mouth every 8 (eight) hours as needed for moderate pain. 10/22/21  Yes Joni Reining, PA-C  methocarbamol (ROBAXIN-750) 750 MG tablet Take 1 tablet (750 mg total) by mouth 4 (four) times daily. 10/22/21  Yes Joni Reining, PA-C  naproxen (NAPROSYN) 500 MG tablet Take 1 tablet (500 mg total) by mouth 2 (two) times daily with a meal. 09/23/21  Yes Joni Reining, PA-C  ondansetron (ZOFRAN-ODT) 4 MG disintegrating tablet ondansetron 4 mg disintegrating tablet  Place 1 tablet every 8 hours by translingual route as needed.   Yes [provider]  oxyCODONE (OXY IR/ROXICODONE) 5 MG immediate release tablet oxycodone 5 mg tablet  Take 1 tablet every 4-6 hours by oral route for 5 days.   Yes [provider]  VYVANSE 30 MG capsule Take 30 mg by mouth daily. 08/02/21  Yes [provider]    Allergies Patient has no known allergies.  Family History  Problem Relation Age of Onset   Cancer Mother    Diabetes Father    Hypertension Father    Cancer Maternal Grandfather    Heart disease Paternal Grandfather     Social History Social History   Tobacco Use   Smoking status: Light Smoker    Types: E-cigarettes   Smokeless tobacco: Former    Types: Snuff   Tobacco comments:    States cut out the cigarettes  Substance Use Topics   Alcohol use: Yes    Review of Systems Constitutional: No fever/chills Eyes: No visual changes. ENT: No  sore throat. Cardiovascular: Denies chest pain. Respiratory: Denies shortness of breath. Gastrointestinal: No abdominal pain.  No nausea, no vomiting.  No diarrhea.  No constipation. Genitourinary: Negative for dysuria. Musculoskeletal: Negative for back pain. Skin: Negative for rash. Neurological: Negative for headaches, focal weakness or numbness. Psychiatric: ADHD Endocrine: Hyperlipidemia  ____________________________________________   PHYSICAL EXAM:  VITAL SIGNS: BP  139/87, pulse 88, respiration 14, temperature 97.7, patient 100% O2 sat on room air.  Patient weighs 185 pounds and BMI is 25.80.   Eyes: Conjunctivae are normal. PERRL. EOMI. Head: Atraumatic. Nose: No congestion/rhinnorhea. Mouth/Throat: Mucous membranes are moist.  Oropharynx non-erythematous. Neck: No stridor.  No cervical spine tenderness to palpation. Hematological/Lymphatic/Immunilogical: No cervical lymphadenopathy. Cardiovascular: Normal rate, regular rhythm. Grossly normal heart sounds.  Good peripheral circulation. Respiratory: Normal respiratory effort.  No retractions. Lungs CTAB. Gastrointestinal: Soft and nontender. No distention. No abdominal bruits. No CVA tenderness. Genitourinary: Deferred Musculoskeletal: Left upper extremity was excluded from the exam.  No lower extremity tenderness nor edema.  No joint effusions. Neurologic:  Normal speech and language. No gross focal neurologic deficits are appreciated. No gait instability. Skin:  Skin is warm, dry and intact. No rash noted. Psychiatric: Mood and affect are normal. Speech and behavior are normal.  ____________________________________________   ____________________________________________  ____________________________________________    ____________________________________________     ____________________________________________   INITIAL IMPRESSION / ASSESSMENT AND PLAN  As part of my medical decision making, I reviewed the following data within the electronic MEDICAL RECORD NUMBER       Discussed lab results with patient showing low iron and elevated triglycerides.  Patient amenable to a trial of iron supplements and lifestyle modifications.      ____________________________________________   FINAL CLINICAL IMPRESSION Well exam   ED Discharge Orders     None        Note:  This document was prepared using Dragon voice recognition software and may include unintentional dictation errors.

## 2022-01-14 NOTE — Progress Notes (Signed)
Pt presents today to complete physical, Pt denies any issues or concerns at this time/CL,RMA 

## 2022-01-15 LAB — CMP12+LP+TP+TSH+6AC+CBC/D/PLT
ALT: 17 IU/L (ref 0–44)
AST: 21 IU/L (ref 0–40)
Albumin/Globulin Ratio: 2 (ref 1.2–2.2)
Albumin: 4.7 g/dL (ref 4.0–5.0)
Alkaline Phosphatase: 99 IU/L (ref 44–121)
BUN/Creatinine Ratio: 15 (ref 9–20)
BUN: 18 mg/dL (ref 6–20)
Basophils Absolute: 0 10*3/uL (ref 0.0–0.2)
Basos: 0 %
Bilirubin Total: 0.3 mg/dL (ref 0.0–1.2)
Calcium: 9.8 mg/dL (ref 8.7–10.2)
Chloride: 101 mmol/L (ref 96–106)
Chol/HDL Ratio: 4.5 ratio (ref 0.0–5.0)
Cholesterol, Total: 196 mg/dL (ref 100–199)
Creatinine, Ser: 1.18 mg/dL (ref 0.76–1.27)
EOS (ABSOLUTE): 0.1 10*3/uL (ref 0.0–0.4)
Eos: 2 %
Estimated CHD Risk: 0.9 times avg. (ref 0.0–1.0)
Free Thyroxine Index: 2.3 (ref 1.2–4.9)
GGT: 17 IU/L (ref 0–65)
Globulin, Total: 2.4 g/dL (ref 1.5–4.5)
Glucose: 73 mg/dL (ref 70–99)
HDL: 44 mg/dL (ref >39)
Hematocrit: 41.7 % (ref 37.5–51.0)
Hemoglobin: 13.1 g/dL (ref 13.0–17.7)
Immature Grans (Abs): 0 10*3/uL (ref 0.0–0.1)
Immature Granulocytes: 0 %
Iron: 36 ug/dL — ABNORMAL LOW (ref 38–169)
LDH: 168 IU/L (ref 121–224)
LDL Chol Calc (NIH): 116 mg/dL — ABNORMAL HIGH (ref 0–99)
Lymphocytes Absolute: 1.4 10*3/uL (ref 0.7–3.1)
Lymphs: 31 %
MCH: 24.6 pg — ABNORMAL LOW (ref 26.6–33.0)
MCHC: 31.4 g/dL — ABNORMAL LOW (ref 31.5–35.7)
MCV: 78 fL — ABNORMAL LOW (ref 79–97)
Monocytes Absolute: 0.4 10*3/uL (ref 0.1–0.9)
Monocytes: 8 %
Neutrophils Absolute: 2.7 10*3/uL (ref 1.4–7.0)
Neutrophils: 59 %
Phosphorus: 2.4 mg/dL — ABNORMAL LOW (ref 2.8–4.1)
Platelets: 440 10*3/uL (ref 150–450)
RBC: 5.32 x10E6/uL (ref 4.14–5.80)
RDW: 13.7 % (ref 11.6–15.4)
Sodium: 140 mmol/L (ref 134–144)
T3 Uptake Ratio: 29 % (ref 24–39)
T4, Total: 7.8 ug/dL (ref 4.5–12.0)
TSH: 1.02 u[IU]/mL (ref 0.450–4.500)
Total Protein: 7.1 g/dL (ref 6.0–8.5)
Triglycerides: 205 mg/dL — ABNORMAL HIGH (ref 0–149)
Uric Acid: 4.2 mg/dL (ref 3.8–8.4)
VLDL Cholesterol Cal: 36 mg/dL (ref 5–40)
WBC: 4.6 10*3/uL (ref 3.4–10.8)
eGFR: 82 mL/min/{1.73_m2} (ref >59)

## 2022-01-17 DIAGNOSIS — M6281 Muscle weakness (generalized): Secondary | ICD-10-CM | POA: Diagnosis not present

## 2022-01-17 DIAGNOSIS — M25512 Pain in left shoulder: Secondary | ICD-10-CM | POA: Diagnosis not present

## 2022-01-17 DIAGNOSIS — M25612 Stiffness of left shoulder, not elsewhere classified: Secondary | ICD-10-CM | POA: Diagnosis not present

## 2022-01-21 DIAGNOSIS — M25612 Stiffness of left shoulder, not elsewhere classified: Secondary | ICD-10-CM | POA: Diagnosis not present

## 2022-01-21 DIAGNOSIS — M25512 Pain in left shoulder: Secondary | ICD-10-CM | POA: Diagnosis not present

## 2022-01-21 DIAGNOSIS — M6281 Muscle weakness (generalized): Secondary | ICD-10-CM | POA: Diagnosis not present

## 2022-01-24 DIAGNOSIS — M25612 Stiffness of left shoulder, not elsewhere classified: Secondary | ICD-10-CM | POA: Diagnosis not present

## 2022-01-24 DIAGNOSIS — M25512 Pain in left shoulder: Secondary | ICD-10-CM | POA: Diagnosis not present

## 2022-01-24 DIAGNOSIS — M6281 Muscle weakness (generalized): Secondary | ICD-10-CM | POA: Diagnosis not present

## 2022-01-27 DIAGNOSIS — M25612 Stiffness of left shoulder, not elsewhere classified: Secondary | ICD-10-CM | POA: Diagnosis not present

## 2022-01-27 DIAGNOSIS — M6281 Muscle weakness (generalized): Secondary | ICD-10-CM | POA: Diagnosis not present

## 2022-01-27 DIAGNOSIS — M25512 Pain in left shoulder: Secondary | ICD-10-CM | POA: Diagnosis not present

## 2022-01-30 DIAGNOSIS — M6281 Muscle weakness (generalized): Secondary | ICD-10-CM | POA: Diagnosis not present

## 2022-01-30 DIAGNOSIS — M25512 Pain in left shoulder: Secondary | ICD-10-CM | POA: Diagnosis not present

## 2022-01-30 DIAGNOSIS — M25612 Stiffness of left shoulder, not elsewhere classified: Secondary | ICD-10-CM | POA: Diagnosis not present

## 2022-02-04 DIAGNOSIS — M25512 Pain in left shoulder: Secondary | ICD-10-CM | POA: Diagnosis not present

## 2022-02-04 DIAGNOSIS — M25612 Stiffness of left shoulder, not elsewhere classified: Secondary | ICD-10-CM | POA: Diagnosis not present

## 2022-02-04 DIAGNOSIS — M6281 Muscle weakness (generalized): Secondary | ICD-10-CM | POA: Diagnosis not present

## 2022-02-11 DIAGNOSIS — M6281 Muscle weakness (generalized): Secondary | ICD-10-CM | POA: Diagnosis not present

## 2022-02-11 DIAGNOSIS — M25612 Stiffness of left shoulder, not elsewhere classified: Secondary | ICD-10-CM | POA: Diagnosis not present

## 2022-02-11 DIAGNOSIS — M25512 Pain in left shoulder: Secondary | ICD-10-CM | POA: Diagnosis not present

## 2022-02-14 DIAGNOSIS — M25612 Stiffness of left shoulder, not elsewhere classified: Secondary | ICD-10-CM | POA: Diagnosis not present

## 2022-02-14 DIAGNOSIS — M25512 Pain in left shoulder: Secondary | ICD-10-CM | POA: Diagnosis not present

## 2022-02-14 DIAGNOSIS — M6281 Muscle weakness (generalized): Secondary | ICD-10-CM | POA: Diagnosis not present

## 2022-02-17 DIAGNOSIS — F902 Attention-deficit hyperactivity disorder, combined type: Secondary | ICD-10-CM | POA: Diagnosis not present

## 2022-02-17 DIAGNOSIS — Z79899 Other long term (current) drug therapy: Secondary | ICD-10-CM | POA: Diagnosis not present

## 2022-02-17 DIAGNOSIS — R69 Illness, unspecified: Secondary | ICD-10-CM | POA: Diagnosis not present

## 2022-02-18 DIAGNOSIS — M6281 Muscle weakness (generalized): Secondary | ICD-10-CM | POA: Diagnosis not present

## 2022-02-18 DIAGNOSIS — M25512 Pain in left shoulder: Secondary | ICD-10-CM | POA: Diagnosis not present

## 2022-02-18 DIAGNOSIS — M25612 Stiffness of left shoulder, not elsewhere classified: Secondary | ICD-10-CM | POA: Diagnosis not present

## 2022-02-24 DIAGNOSIS — M25512 Pain in left shoulder: Secondary | ICD-10-CM | POA: Diagnosis not present

## 2022-02-24 DIAGNOSIS — M25612 Stiffness of left shoulder, not elsewhere classified: Secondary | ICD-10-CM | POA: Diagnosis not present

## 2022-02-24 DIAGNOSIS — M6281 Muscle weakness (generalized): Secondary | ICD-10-CM | POA: Diagnosis not present

## 2022-02-27 DIAGNOSIS — M6281 Muscle weakness (generalized): Secondary | ICD-10-CM | POA: Diagnosis not present

## 2022-02-27 DIAGNOSIS — M25512 Pain in left shoulder: Secondary | ICD-10-CM | POA: Diagnosis not present

## 2022-02-27 DIAGNOSIS — M25612 Stiffness of left shoulder, not elsewhere classified: Secondary | ICD-10-CM | POA: Diagnosis not present

## 2022-03-06 DIAGNOSIS — M25512 Pain in left shoulder: Secondary | ICD-10-CM | POA: Diagnosis not present

## 2022-03-06 DIAGNOSIS — M6281 Muscle weakness (generalized): Secondary | ICD-10-CM | POA: Diagnosis not present

## 2022-03-06 DIAGNOSIS — M25612 Stiffness of left shoulder, not elsewhere classified: Secondary | ICD-10-CM | POA: Diagnosis not present

## 2022-03-13 DIAGNOSIS — M25512 Pain in left shoulder: Secondary | ICD-10-CM | POA: Diagnosis not present

## 2022-03-13 DIAGNOSIS — M6281 Muscle weakness (generalized): Secondary | ICD-10-CM | POA: Diagnosis not present

## 2022-03-13 DIAGNOSIS — M25612 Stiffness of left shoulder, not elsewhere classified: Secondary | ICD-10-CM | POA: Diagnosis not present

## 2022-03-18 DIAGNOSIS — M25612 Stiffness of left shoulder, not elsewhere classified: Secondary | ICD-10-CM | POA: Diagnosis not present

## 2022-03-18 DIAGNOSIS — M6281 Muscle weakness (generalized): Secondary | ICD-10-CM | POA: Diagnosis not present

## 2022-03-18 DIAGNOSIS — M25512 Pain in left shoulder: Secondary | ICD-10-CM | POA: Diagnosis not present

## 2022-03-26 DIAGNOSIS — M6281 Muscle weakness (generalized): Secondary | ICD-10-CM | POA: Diagnosis not present

## 2022-03-26 DIAGNOSIS — M25512 Pain in left shoulder: Secondary | ICD-10-CM | POA: Diagnosis not present

## 2022-03-26 DIAGNOSIS — M25612 Stiffness of left shoulder, not elsewhere classified: Secondary | ICD-10-CM | POA: Diagnosis not present

## 2022-04-02 DIAGNOSIS — M25612 Stiffness of left shoulder, not elsewhere classified: Secondary | ICD-10-CM | POA: Diagnosis not present

## 2022-04-02 DIAGNOSIS — M6281 Muscle weakness (generalized): Secondary | ICD-10-CM | POA: Diagnosis not present

## 2022-04-02 DIAGNOSIS — M25512 Pain in left shoulder: Secondary | ICD-10-CM | POA: Diagnosis not present

## 2022-04-08 DIAGNOSIS — M25512 Pain in left shoulder: Secondary | ICD-10-CM | POA: Diagnosis not present

## 2022-04-08 DIAGNOSIS — M6281 Muscle weakness (generalized): Secondary | ICD-10-CM | POA: Diagnosis not present

## 2022-04-08 DIAGNOSIS — M25612 Stiffness of left shoulder, not elsewhere classified: Secondary | ICD-10-CM | POA: Diagnosis not present

## 2022-04-16 DIAGNOSIS — M25512 Pain in left shoulder: Secondary | ICD-10-CM | POA: Diagnosis not present

## 2022-04-16 DIAGNOSIS — M6281 Muscle weakness (generalized): Secondary | ICD-10-CM | POA: Diagnosis not present

## 2022-04-16 DIAGNOSIS — M25612 Stiffness of left shoulder, not elsewhere classified: Secondary | ICD-10-CM | POA: Diagnosis not present

## 2022-05-21 DIAGNOSIS — Z79899 Other long term (current) drug therapy: Secondary | ICD-10-CM | POA: Diagnosis not present

## 2022-05-21 DIAGNOSIS — R69 Illness, unspecified: Secondary | ICD-10-CM | POA: Diagnosis not present

## 2022-05-21 DIAGNOSIS — F902 Attention-deficit hyperactivity disorder, combined type: Secondary | ICD-10-CM | POA: Diagnosis not present

## 2022-05-22 DIAGNOSIS — R69 Illness, unspecified: Secondary | ICD-10-CM | POA: Diagnosis not present

## 2022-05-22 DIAGNOSIS — M25512 Pain in left shoulder: Secondary | ICD-10-CM | POA: Diagnosis not present

## 2022-05-22 DIAGNOSIS — Z79899 Other long term (current) drug therapy: Secondary | ICD-10-CM | POA: Diagnosis not present

## 2022-07-10 DIAGNOSIS — M25512 Pain in left shoulder: Secondary | ICD-10-CM | POA: Diagnosis not present

## 2022-09-08 DIAGNOSIS — M25512 Pain in left shoulder: Secondary | ICD-10-CM | POA: Diagnosis not present

## 2022-09-09 ENCOUNTER — Encounter: Payer: Self-pay | Admitting: Physician Assistant

## 2022-09-09 ENCOUNTER — Ambulatory Visit: Payer: Self-pay | Admitting: Physician Assistant

## 2022-09-09 DIAGNOSIS — M25512 Pain in left shoulder: Secondary | ICD-10-CM

## 2022-09-09 NOTE — Progress Notes (Signed)
   Subjective: Return to work evaluation    Patient ID: George Wong, male    DOB: 03-Jan-1986, 37 y.o.   MRN: [id number]  HPI Patient released by treating orthopedic to return back to full duty status post left rotator cuff repair.  Patient voiced no concerns or complaints at this time.   Review of Systems Negative except for chief complaint    Objective:   Physical Exam  Examination left shoulder shows no obvious deformity.  Patient has full and equal range of motion.  Patient strength is 4/5 in comparison to right dominant upper extremity.      Assessment & Plan: Healed rotator cuff of the left shoulder   Patient given a trial of full duties.  Follow-up with orthopedics if condition worsens.

## 2022-09-09 NOTE — Progress Notes (Signed)
Pt presents today for RTN work post surgery. Pt states he's doing fine.

## 2023-01-14 IMAGING — CR DG SHOULDER 2+V*L*
1 series · 4 of 4 positions shown · non-contrast
Comparison: None.

CLINICAL DATA: Acute pain while weight lifting.

EXAM:
LEFT SHOULDER - 2+ VIEW

[Series 1: dg shoulder left · 0.14mm/px · 4 of 4 slices shown]
[im 1/4]
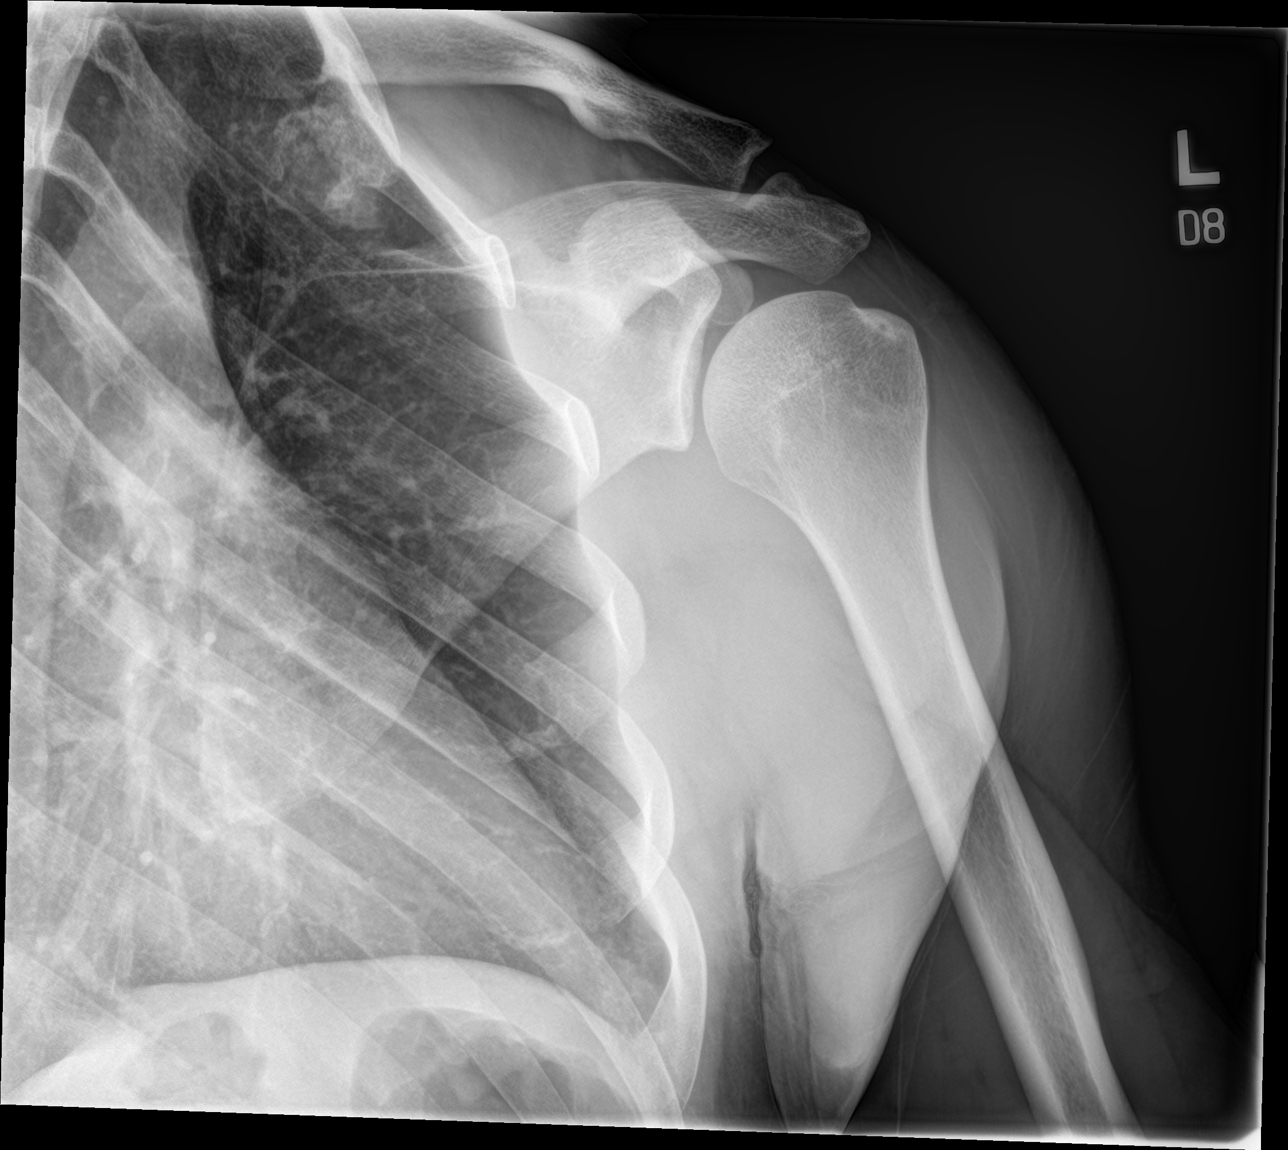
[im 2/4]
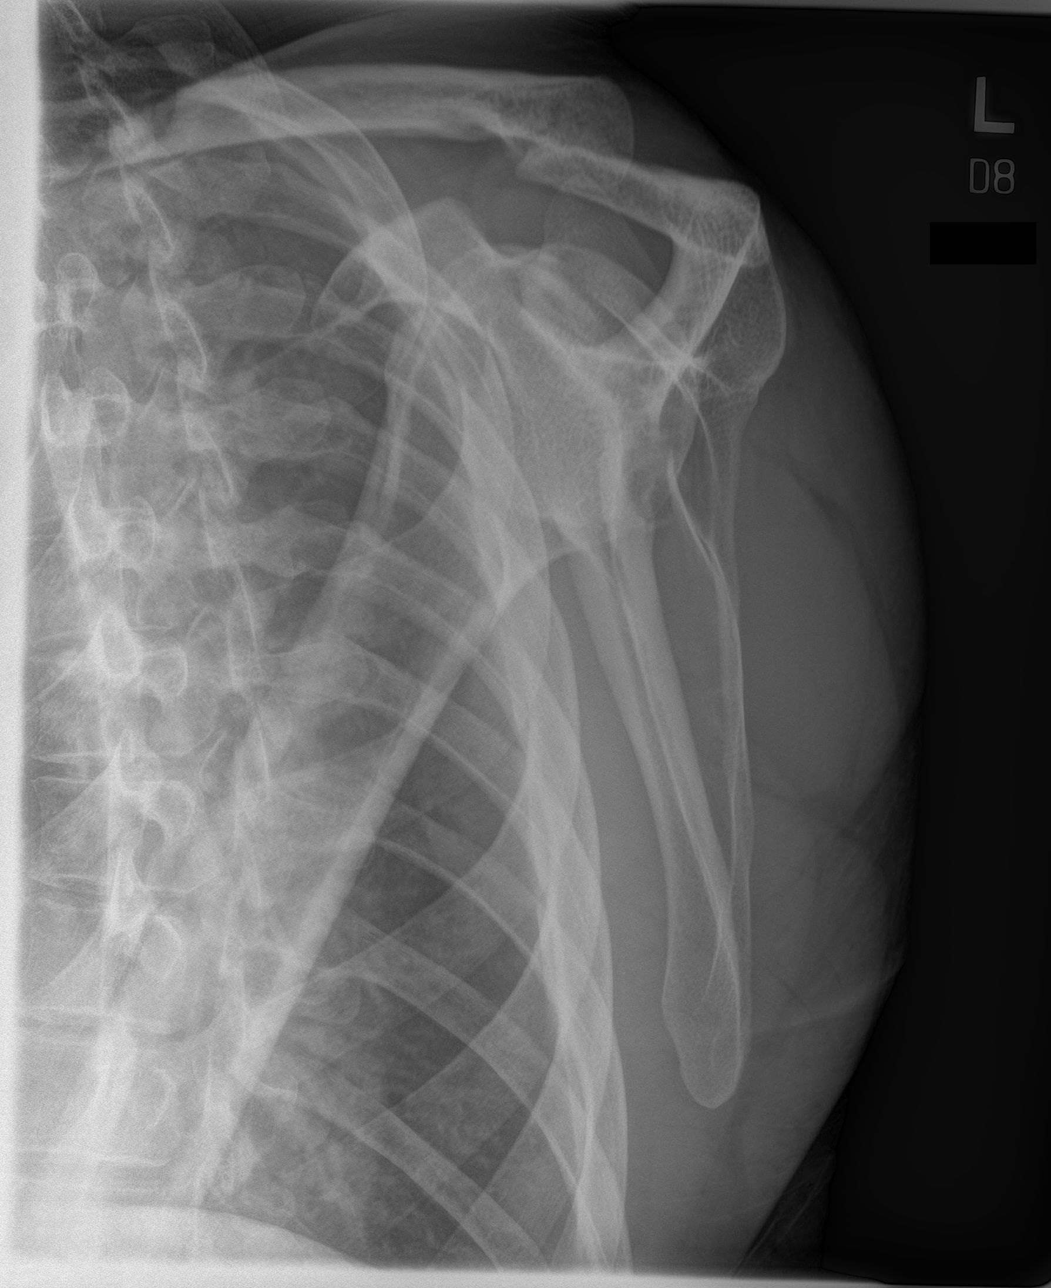
[im 3/4]
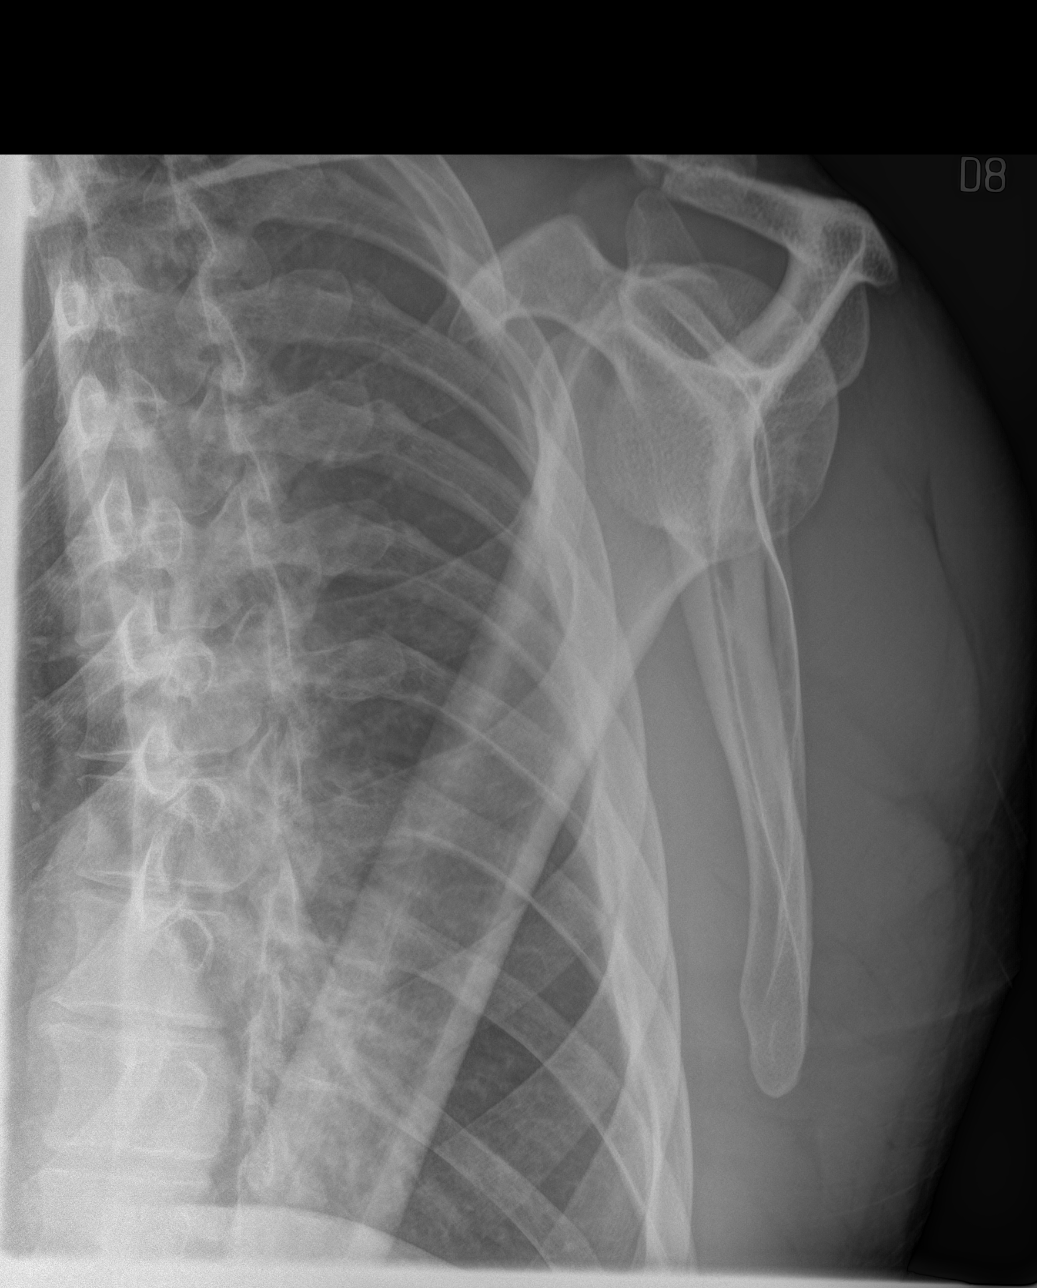
[im 4/4]
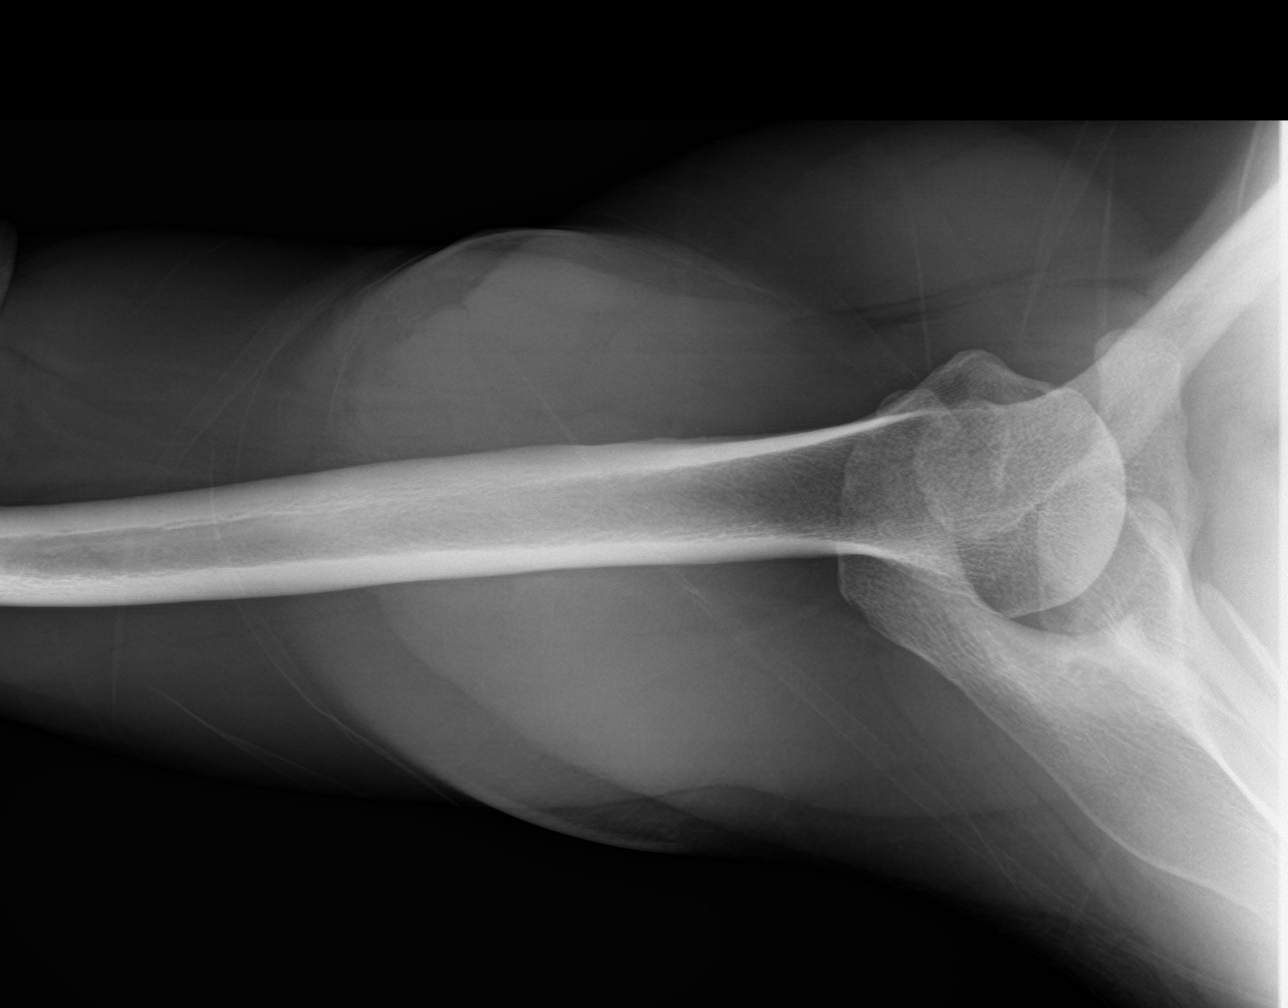

[4 of 4 positions shown; findings below may reference images not displayed]

FINDINGS: There is no evidence of fracture or dislocation. There is no
evidence of arthropathy or other focal bone abnormality. Soft
tissues are unremarkable.
IMPRESSION: Negative.

## 2023-02-09 DIAGNOSIS — H5213 Myopia, bilateral: Secondary | ICD-10-CM | POA: Diagnosis not present

## 2023-02-24 ENCOUNTER — Encounter: Payer: Self-pay | Admitting: Physician Assistant

## 2023-02-24 ENCOUNTER — Ambulatory Visit: Payer: Self-pay | Admitting: Physician Assistant

## 2023-02-24 DIAGNOSIS — R5383 Other fatigue: Secondary | ICD-10-CM

## 2023-02-24 NOTE — Progress Notes (Signed)
Pt presents today with fatigue and no motivation to do any activity in the last year.

## 2023-02-24 NOTE — Progress Notes (Signed)
   Subjective: Fatigue    Patient ID: George Wong, male    DOB: 06-Jun-1986, 37 y.o.   MRN: [id number] HPI Patient complains of increasing fatigue for past 3 months.  Patient works in [employer] and states he noticed a decrease of his normal energy levels.  Patient  gets between 6 to 7 hours of sleep but does admit to difficulty falling asleep.  Patient was diagnosed with ADHD and given a prescription for VyVanse two years ago.  Patient states he discontinued medication about 1 year ago.  Stated discontinued secondary to the medicine side effects.  States medication makes him feel to relax hindering his job performance.  Patient did not notify prescribing doctor that he discontinued medication.  Denies depression. 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    Objective:   Physical Exam Vital signs not taken.  No acute distress.  Flat affect.       Assessment & Plan: Fatigue  We will check a metabolic profile with vitamin W09, vitamin D, and testosterone levels.  Patient will follow-up after lab results.

## 2023-03-01 LAB — TESTOSTERONE,FREE AND TOTAL
Testosterone, Free: 8.5 pg/mL — ABNORMAL LOW (ref 8.7–25.1)
Testosterone: 436 ng/dL (ref 264–916)

## 2023-03-01 LAB — VITAMIN B12: Vitamin B-12: 799 pg/mL (ref 232–1245)

## 2023-03-01 LAB — VITAMIN D 25 HYDROXY (VIT D DEFICIENCY, FRACTURES): Vit D, 25-Hydroxy: 25.2 ng/mL — ABNORMAL LOW (ref 30.0–100.0)

## 2023-03-16 ENCOUNTER — Ambulatory Visit: Payer: Self-pay | Admitting: Physician Assistant

## 2023-04-01 ENCOUNTER — Other Ambulatory Visit: Payer: Self-pay

## 2023-04-01 NOTE — Progress Notes (Signed)
Random UDS and BAT completed per [facility] policy after consent.  Cleared BAT and UDS sent to [facility].

## 2023-04-15 DIAGNOSIS — Z79890 Hormone replacement therapy (postmenopausal): Secondary | ICD-10-CM | POA: Diagnosis not present

## 2023-07-27 DIAGNOSIS — Z125 Encounter for screening for malignant neoplasm of prostate: Secondary | ICD-10-CM | POA: Diagnosis not present

## 2023-07-27 DIAGNOSIS — R6882 Decreased libido: Secondary | ICD-10-CM | POA: Diagnosis not present

## 2023-07-27 DIAGNOSIS — E291 Testicular hypofunction: Secondary | ICD-10-CM | POA: Diagnosis not present

## 2023-07-27 DIAGNOSIS — Z79899 Other long term (current) drug therapy: Secondary | ICD-10-CM | POA: Diagnosis not present

## 2023-11-04 ENCOUNTER — Ambulatory Visit: Payer: Self-pay

## 2023-11-04 DIAGNOSIS — Z0000 Encounter for general adult medical examination without abnormal findings: Secondary | ICD-10-CM

## 2023-11-04 LAB — POCT URINALYSIS DIPSTICK
Blood, UA: NEGATIVE
Glucose, UA: NEGATIVE
Ketones, UA: NEGATIVE
Leukocytes, UA: NEGATIVE
Nitrite, UA: NEGATIVE
Protein, UA: POSITIVE — AB
Spec Grav, UA: >=1.03 — AB (ref 1.010–1.025)
Urobilinogen, UA: 0.2 U/dL
pH, UA: 6 (ref 5.0–8.0)

## 2023-11-04 NOTE — Progress Notes (Signed)
 Pt completed EKG, LABS &UA for physical. George Wong

## 2023-11-05 LAB — CMP12+LP+TP+TSH+6AC+CBC/D/PLT
ALT: 26 IU/L (ref 0–44)
AST: 26 IU/L (ref 0–40)
Albumin: 4.3 g/dL (ref 4.1–5.1)
Alkaline Phosphatase: 91 IU/L (ref 44–121)
BUN/Creatinine Ratio: 19 (ref 9–20)
BUN: 21 mg/dL — ABNORMAL HIGH (ref 6–20)
Basophils Absolute: 0 10*3/uL (ref 0.0–0.2)
Basos: 1 %
Bilirubin Total: 0.6 mg/dL (ref 0.0–1.2)
Calcium: 9.4 mg/dL (ref 8.7–10.2)
Chloride: 102 mmol/L (ref 96–106)
Chol/HDL Ratio: 5.8 ratio — ABNORMAL HIGH (ref 0.0–5.0)
Cholesterol, Total: 237 mg/dL — ABNORMAL HIGH (ref 100–199)
Creatinine, Ser: 1.08 mg/dL (ref 0.76–1.27)
EOS (ABSOLUTE): 0.2 10*3/uL (ref 0.0–0.4)
Eos: 3 %
Estimated CHD Risk: 1.2 times avg. — ABNORMAL HIGH (ref 0.0–1.0)
Free Thyroxine Index: 1.8 (ref 1.2–4.9)
GGT: 18 IU/L (ref 0–65)
Globulin, Total: 2.2 g/dL (ref 1.5–4.5)
Glucose: 92 mg/dL (ref 70–99)
HDL: 41 mg/dL (ref >39)
Hematocrit: 44.5 % (ref 37.5–51.0)
Hemoglobin: 14.6 g/dL (ref 13.0–17.7)
Immature Grans (Abs): 0 10*3/uL (ref 0.0–0.1)
Immature Granulocytes: 0 %
Iron: 163 ug/dL (ref 38–169)
LDH: 175 IU/L (ref 121–224)
LDL Chol Calc (NIH): 166 mg/dL — ABNORMAL HIGH (ref 0–99)
Lymphocytes Absolute: 1.2 10*3/uL (ref 0.7–3.1)
Lymphs: 28 %
MCH: 26.2 pg — ABNORMAL LOW (ref 26.6–33.0)
MCHC: 32.8 g/dL (ref 31.5–35.7)
MCV: 80 fL (ref 79–97)
Monocytes Absolute: 0.4 10*3/uL (ref 0.1–0.9)
Monocytes: 9 %
Neutrophils Absolute: 2.7 10*3/uL (ref 1.4–7.0)
Neutrophils: 59 %
Phosphorus: 2.6 mg/dL — ABNORMAL LOW (ref 2.8–4.1)
Platelets: 330 10*3/uL (ref 150–450)
Potassium: 4.3 mmol/L (ref 3.5–5.2)
RBC: 5.58 x10E6/uL (ref 4.14–5.80)
RDW: 16.7 % — ABNORMAL HIGH (ref 11.6–15.4)
Sodium: 139 mmol/L (ref 134–144)
T3 Uptake Ratio: 27 % (ref 24–39)
T4, Total: 6.8 ug/dL (ref 4.5–12.0)
TSH: 0.825 u[IU]/mL (ref 0.450–4.500)
Total Protein: 6.5 g/dL (ref 6.0–8.5)
Triglycerides: 165 mg/dL — ABNORMAL HIGH (ref 0–149)
Uric Acid: 4.9 mg/dL (ref 3.8–8.4)
VLDL Cholesterol Cal: 30 mg/dL (ref 5–40)
WBC: 4.4 10*3/uL (ref 3.4–10.8)
eGFR: 91 mL/min/{1.73_m2} (ref >59)

## 2023-11-11 ENCOUNTER — Encounter: Payer: Self-pay | Admitting: Physician Assistant

## 2023-11-11 ENCOUNTER — Ambulatory Visit: Payer: Self-pay | Admitting: Physician Assistant

## 2023-11-11 VITALS — BP 136/68 | HR 76 | Temp 97.8°F | Resp 14 | Ht 70.0 in | Wt 207.0 lb

## 2023-11-11 DIAGNOSIS — Z0000 Encounter for general adult medical examination without abnormal findings: Secondary | ICD-10-CM

## 2023-11-11 MED ORDER — ROSUVASTATIN CALCIUM 40 MG PO TABS: 40.0000 mg | ORAL_TABLET | Freq: Every day | ORAL | 3 refills | Status: AC

## 2023-11-11 NOTE — Progress Notes (Signed)
 [facility]  ____________________________________________   None    (approximate)  I have reviewed the triage vital signs and the nursing notes.   HISTORY  Chief Complaint Annual Exam   HPI George Wong is a 38 y.o. male patient presents for annual physical exam.  Voices no concerns or complaints.         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   Myopia 11/07/2020   Need for prophylactic vaccination with typhoid-paratyphoid alone (TAB) 11/07/2020   Oligospermia 11/07/2020   Other aftercare following surgery 11/07/2020   Other testicular hypofunction 11/07/2020   Pityriasis versicolor 11/07/2020   Regular astigmatism 11/07/2020   Screening examination for pulmonary tuberculosis 11/07/2020   Sprain of left ankle 11/07/2020   Tobacco use disorder 11/07/2020   Health examination of defined subpopulation 03/16/2019   History of bilateral inguinal hernia repair 03/16/2019   Low back pain 09/16/2018   Prepatellar bursitis, left knee 02/16/2018    Past Surgical History:  Procedure Laterality Date   HERNIA REPAIR Right 2009   INGUINAL HERNIA REPAIR Left 2003   SHOULDER ARTHROSCOPY WITH LABRAL REPAIR  01/03/2022   posterior   WISDOM TOOTH EXTRACTION Bilateral 2005   All four removed    Prior to Admission medications   Not on File    Allergies Patient has no known allergies.  Family History  Problem  Relation Age of Onset   Cancer Mother    Diabetes Father    Hypertension Father    Cancer Maternal Grandfather    Heart disease Paternal Grandfather     Social History Social History   Tobacco Use   Smoking status: Light Smoker    Types: E-cigarettes   Smokeless tobacco: Former    Types: Snuff   Tobacco comments:    States cut out the cigarettes  Substance Use Topics   Alcohol use: Yes    Review of Systems Constitutional: No fever/chills Eyes: No visual changes. ENT: No sore throat. Cardiovascular: Denies chest pain. Respiratory: Denies shortness of breath. Gastrointestinal: No abdominal pain.  No nausea, no vomiting.  No diarrhea.  No constipation. Genitourinary: Negative for dysuria. Musculoskeletal: Negative for back pain. Skin: Negative for rash. Neurological: Negative for headaches, focal weakness or numbness. ____________________________________________   PHYSICAL EXAM:  VITAL SIGNS: BP 136/68  Cuff Size Large  Pulse Rate 76  Temp 97.8 F (36.6 C)  Temp Source Oral  Weight 207 lb (93.9 kg)  Height 5\' 10"  (1.778 m)  Resp 14  SpO2 99 %   BMI: 29.70 kg/m2  BSA: 2.15 m2   Constitutional: Alert and oriented. Well appearing and in no acute distress. Eyes: Conjunctivae are normal. PERRL. EOMI. Head: Atraumatic. Nose: No congestion/rhinnorhea. Mouth/Throat: Mucous membranes are moist.  Oropharynx non-erythematous. Neck: No stridor.  No cervical spine tenderness to palpation. Hematological/Lymphatic/Immunilogical: No cervical lymphadenopathy. Cardiovascular: Normal rate, regular rhythm. Grossly normal heart sounds.  Good peripheral circulation. Respiratory: Normal respiratory effort.  No retractions. Lungs CTAB.  Gastrointestinal: Soft and nontender. No distention. No abdominal bruits. No CVA tenderness. Genitourinary: Deferred Musculoskeletal: No lower extremity tenderness nor edema.  No joint effusions. Neurologic:  Normal speech and language. No gross  focal neurologic deficits are appreciated. No gait instability. Skin:  Skin is warm, dry and intact. No rash noted. Psychiatric: Mood and affect are normal. Speech and behavior are normal.  ____________________________________________   LABS         Component Ref Range & Units (hover) 7 d ago 1 yr ago 2 yr ago 3 yr ago 4 yr ago  Color, UA yellow Dark Yellow Yellow yellow Lt Amber  Clarity, UA clear Clear Clear clear Clear  Glucose, UA Negative Negative Negative Negative Negative  Bilirubin, UA 1+ Abnormal  Negative Negative negative Negative  Ketones, UA neg Negative Negative negative Negative  Spec Grav, UA >=1.030 Abnormal  >=1.030 Abnormal  >=1.030 Abnormal  >=1.030 Abnormal  >=1.030 Abnormal   Blood, UA neg Negative Negative negatve Negative  pH, UA 6.0 6.0 6.0 6.0 6.0  Protein, UA Positive Abnormal  Negative Negative Negative Negative  Comment: trace -+  Urobilinogen, UA 0.2 0.2 0.2 negative Abnormal  0.2  Nitrite, UA neg Negative Negative negative Negative  Leukocytes, UA Negative Negative Negative Negative Negative  Appearance       Odor                  View All Conversations on this Encounter             Component Ref Range & Units (hover) 7 d ago 1 yr ago 2 yr ago 3 yr ago 4 yr ago  Glucose 92 73 91 R 81 R 85 R  Uric Acid 4.9 4.2 CM 5.0 CM 4.0 CM 5.7 R, CM  Comment:            Therapeutic target for gout patients: <6.0  BUN 21 High  18 18 15 12   Creatinine, Ser 1.08 1.18 1.16 1.11 1.25  eGFR 91 82 84    BUN/Creatinine Ratio 19 15 16 14 10   Sodium 139 140 142 139 140  Potassium 4.3 CANCELED R, CM 4.3 4.0 4.1  Chloride 102 101 104 102 100  Calcium 9.4 9.8 9.3 9.6 9.5  Phosphorus 2.6 Low  2.4 Low  3.3 2.8 3.4  Total Protein 6.5 7.1 6.7 6.8 6.7  Albumin 4.3 4.7 R 4.2 R 4.4 R 4.3 R  Globulin, Total 2.2 2.4 2.5 2.4 2.4  Bilirubin Total 0.6 0.3 0.3 0.2 0.3  Alkaline Phosphatase 91 99 76 76 R, CM 68 R  LDH 175 168 158 158 204  AST 26 21 19 21  41 High   ALT  26 17 20 26  37  GGT 18 17 17 14 16   Iron 163 36 Low  47 40 78  Cholesterol, Total 237 High  196 211 High  223 High  193  Triglycerides 165 High  205 High  114 154 High  167 High   HDL 41 44 45 46 46  VLDL Cholesterol Cal 30 36 21 28 33  LDL Chol Calc (NIH) 166 High  116 High  145 High  149 High    Chol/HDL Ratio 5.8 High  4.5 CM 4.7 CM 4.8 CM 4.2 CM  Comment:                                   T. Chol/HDL Ratio  Men  Women                               1/2 Avg.Risk  3.4    3.3                                   Avg.Risk  5.0    4.4                                2X Avg.Risk  9.6    7.1                                3X Avg.Risk 23.4   11.0  Estimated CHD Risk 1.2 High  0.9 CM 0.9 CM 1.0 CM 0.8 CM  Comment: The CHD Risk is based on the T. Chol/HDL ratio. Other factors affect CHD Risk such as hypertension, smoking, diabetes, severe obesity, and family history of premature CHD.  TSH 0.825 1.020 0.880 1.540 1.220  T4, Total 6.8 7.8 6.6 5.5 6.9  T3 Uptake Ratio 27 29 27 26 29   Free Thyroxine Index 1.8 2.3 1.8 1.4 2.0  WBC 4.4 4.6 3.6 3.2 Low  4.8  RBC 5.58 5.32 4.80 5.01 4.77  Hemoglobin 14.6 13.1 12.8 Low  14.7 13.9  Hematocrit 44.5 41.7 37.9 42.2 40.8  MCV 80 78 Low  79 84 86  MCH 26.2 Low  24.6 Low  26.7 29.3 29.1  MCHC 32.8 31.4 Low  33.8 34.8 34.1  RDW 16.7 High  13.7 14.0 13.4 12.9  Platelets 330 440 346 287 304  Neutrophils 59 59 47 45 52  Lymphs 28 31 38 40 35  Monocytes 9 8 11 10 10   Eos 3 2 3 4 2   Basos 1 0 1 1 1   Neutrophils Absolute 2.7 2.7 1.7 1.5 2.5  Lymphocytes Absolute 1.2 1.4 1.4 1.3 1.7  Monocytes Absolute 0.4 0.4 0.4 0.3 0.5  EOS (ABSOLUTE) 0.2 0.1 0.1 0.1 0.1  Basophils Absolute 0.0 0.0 0.0 0.0 0.0  Immature Granulocytes 0 0 0 0 0  Immature Grans (Abs) 0.0 0.0 0.0 0.0 0.0            ____________________________________________  EKG  Sinus rhythm at 66  bpm ____________________________________________    ____________________________________________   INITIAL IMPRESSION / ASSESSMENT AND PLAN / ED COURSE  As part of my medical decision making, I reviewed the following data within the electronic MEDICAL RECORD NUMBER Notes from prior ED visits and [place] Controlled Substance Database     No acute findings on physical exam and EKG.  Labs reveals mixed hyperlipidemia.  Patient amenable to a trial of statins.  Patient will follow-up fasting lipids in 3 months.        ____________________________________________   FINAL CLINICAL IMPRESSION  Well exam   ED Discharge Orders     None        Note:  This document was prepared using Dragon voice recognition software and may include unintentional dictation errors.

## 2023-11-11 NOTE — Progress Notes (Signed)
 Pt presents today to complete physical, pt didn't voice any concerns at this time. George Wong

## 2024-02-08 DIAGNOSIS — Z79899 Other long term (current) drug therapy: Secondary | ICD-10-CM | POA: Diagnosis not present

## 2024-02-08 DIAGNOSIS — E291 Testicular hypofunction: Secondary | ICD-10-CM | POA: Diagnosis not present

## 2024-02-09 ENCOUNTER — Other Ambulatory Visit: Payer: Self-pay

## 2024-02-10 ENCOUNTER — Other Ambulatory Visit: Payer: Self-pay

## 2024-02-10 DIAGNOSIS — E782 Mixed hyperlipidemia: Secondary | ICD-10-CM

## 2024-02-11 LAB — LIPID PANEL
Chol/HDL Ratio: 3.7 ratio (ref 0.0–5.0)
Cholesterol, Total: 143 mg/dL (ref 100–199)
HDL: 39 mg/dL — ABNORMAL LOW (ref >39)
LDL Chol Calc (NIH): 85 mg/dL (ref 0–99)
Triglycerides: 104 mg/dL (ref 0–149)
VLDL Cholesterol Cal: 19 mg/dL (ref 5–40)

## 2024-03-21 ENCOUNTER — Ambulatory Visit: Payer: Self-pay | Admitting: Physician Assistant

## 2024-03-21 ENCOUNTER — Encounter: Payer: Self-pay | Admitting: Physician Assistant

## 2024-03-21 VITALS — BP 133/94 | HR 75 | Temp 98.4°F | Resp 16 | Ht 71.0 in | Wt 202.0 lb

## 2024-03-21 DIAGNOSIS — R1032 Left lower quadrant pain: Secondary | ICD-10-CM

## 2024-03-21 LAB — POCT URINALYSIS DIPSTICK
Blood, UA: NEGATIVE
Glucose, UA: NEGATIVE
Ketones, UA: NEGATIVE
Leukocytes, UA: NEGATIVE
Nitrite, UA: NEGATIVE
Protein, UA: NEGATIVE
Spec Grav, UA: 1.01 (ref 1.010–1.025)
Urobilinogen, UA: 0.2 U/dL
pH, UA: 7 (ref 5.0–8.0)

## 2024-03-21 NOTE — Addendum Note (Signed)
 Addended by: ELUTERIO JENKINS HERO on: 03/21/2024 02:07 PM   Modules accepted: Orders

## 2024-03-21 NOTE — Progress Notes (Signed)
 Lower mid abd pain, possible urinary retention, gas pains   since Friday.

## 2024-03-21 NOTE — Addendum Note (Signed)
 Addended by: CINDIE SMILES F on: 03/21/2024 10:55 AM   Modules accepted: Orders

## 2024-03-21 NOTE — Progress Notes (Signed)
   Subjective: Left lower and mid abdominal pain    Patient ID: George Wong, male    DOB: Aug 08, 1986, 38 y.o.   MRN: [id number]  HPI Patient complaining of left lower and mid lower abdominal pain for 2 to 3 months.  Patient states history of flatulence.  Strong family history of colon cancer and diverticulitis.  Patient also complaining of urinary hesitancy.  Denies diarrhea.  Denies fever or chills.  No change in dietary history.   Review of Systems Hemorrhoids and testicular hypofunction     Objective:   Physical Exam BP 133/94  Cuff Size Large  Pulse Rate 75  Temp 98.4 F (36.9 C)  Temp Source Temporal  Weight 202 lb (91.6 kg)  Height 5' 11 (1.803 m)  Resp 16  SpO2 97 %   BMI: 28.17 kg/m2  BSA: 2.14 m2  HEENT is unremarkable.  Neck is supple for lymphadenopathy or bruits. Lungs are clear to auscultation. Heart is regular rate and rhythm. Abdomen with negative HSM, hyperactive bowel sounds, soft with moderate guarding palpation left lateral and lower abdominal area.        Assessment & Plan: Left lower abdominal pain  Discussed differentials consisting of increased abdominal gas versus diverticulitis.  Will order abdominal CT with contrast for definitive evaluation.  Consider consult to gastroenterology.

## 2024-03-22 ENCOUNTER — Telehealth: Payer: Self-pay

## 2024-03-22 NOTE — Progress Notes (Signed)
 PA initiated through [employer] for Abd/Pelvis CT Scan w/contrast as ordered by Tanda Sharps, PA-C on 03/21/2024.  Received PA approval notification from [employer] after sending requested supporting documentation.  Authorization #:  [id number] Case #:  [id number] Status:  Approved Case Initiation Date:  03/21/2024 Expiration Date:  09/17/2024

## 2024-03-22 NOTE — Telephone Encounter (Signed)
 Tried to call George Wong at [phone] & went straight to voice mail. Left call back message.

## 2024-03-23 ENCOUNTER — Ambulatory Visit
Admission: RE | Admit: 2024-03-23 | Discharge: 2024-03-23 | Disposition: A | Discharge status: Home / Self Care | Source: Ambulatory Visit | Attending: Physician Assistant | Admitting: Physician Assistant

## 2024-03-23 DIAGNOSIS — K5732 Diverticulitis of large intestine without perforation or abscess without bleeding: Secondary | ICD-10-CM | POA: Diagnosis not present

## 2024-03-23 DIAGNOSIS — R935 Abnormal findings on diagnostic imaging of other abdominal regions, including retroperitoneum: Secondary | ICD-10-CM | POA: Diagnosis not present

## 2024-03-23 DIAGNOSIS — R1032 Left lower quadrant pain: Secondary | ICD-10-CM | POA: Diagnosis not present

## 2024-03-23 MED ORDER — IOHEXOL 300 MG/ML  SOLN
100.0000 mL | Freq: Once | INTRAMUSCULAR | Status: AC | PRN
  Administered 2024-03-23: 100 mL via INTRAVENOUS

## 2024-03-23 MED ORDER — IOHEXOL 9 MG/ML PO SOLN
500.0000 mL | ORAL | Status: AC
  Administered 2024-03-23 (×2): 500 mL via ORAL

## 2024-03-30 ENCOUNTER — Other Ambulatory Visit: Payer: Self-pay | Admitting: Physician Assistant

## 2024-03-30 ENCOUNTER — Telehealth: Payer: Self-pay

## 2024-03-30 DIAGNOSIS — K5732 Diverticulitis of large intestine without perforation or abscess without bleeding: Secondary | ICD-10-CM

## 2024-03-30 DIAGNOSIS — F17200 Nicotine dependence, unspecified, uncomplicated: Secondary | ICD-10-CM | POA: Insufficient documentation

## 2024-03-30 DIAGNOSIS — R030 Elevated blood-pressure reading, without diagnosis of hypertension: Secondary | ICD-10-CM | POA: Insufficient documentation

## 2024-03-30 MED ORDER — CIPROFLOXACIN HCL 500 MG PO TABS: 500.0000 mg | ORAL_TABLET | Freq: Two times a day (BID) | ORAL | 0 refills | Status: AC

## 2024-03-30 MED ORDER — METRONIDAZOLE 500 MG PO TABS: 500.0000 mg | ORAL_TABLET | Freq: Two times a day (BID) | ORAL | 0 refills | Status: AC

## 2024-03-30 NOTE — Telephone Encounter (Signed)
 Message from Tanda Sharps, PA-C: Consult to gastroenterology. Diagnosis: diverticulitis with strong family history of colon cancer. CT scan consistent with acute uncomplicated sigmoid diverticulitis.

## 2024-05-23 ENCOUNTER — Encounter: Payer: Self-pay | Admitting: Physician Assistant

## 2024-07-12 NOTE — Progress Notes (Unsigned)
 George Console, PA-C [address] [place], [place]  [address] Phone: [phone]   Gastroenterology Consultation  Referring Provider:     [name] Primary Care Physician:  [name] Primary Gastroenterologist:  George Console, PA-C / George Kiang, MD  Reason for Consultation:     Follow-up diverticulitis, LLQ Pain, Gas        HPI:   Discussed the use of AI scribe software for clinical note transcription with the patient, who gave verbal consent to proceed.  He was referred by a previous provider for evaluation and management of diverticulitis and to discuss a colonoscopy.  Patient was seen at [facility] employee health 03/21/2024 to evaluate mid lower abdominal pain for 2 to 3 months.  No diarrhea, fever, or chills.  Patient reported strong family history of colon cancer and diverticulitis.  Patient has personal history of hemorrhoids.  03/30/2024 CT abdomen pelvis with contrast: 1.  Acute uncomplicated sigmoid diverticulitis.  No perforation. 2.  No other abnormality. **He was treated with Cipro  500 twice daily and metronidazole  500 twice daily x 7 days.  Advised to follow-up with GI.  Family history of colon cancer:  History of Present Illness He has a long-standing history of abdominal discomfort, primarily in the left lower abdomen, with episodes of pain that began in his early twenties. The discomfort is intermittent and sometimes accompanied by constipation and diarrhea, with constipation being more frequent. The pain can be sharp and has occurred a few times from the umbilical region to the pelvic area, prompting him to seek medical attention.  In July, a CT scan revealed diverticulitis in the sigmoid colon, for which he was treated with Cipro  and metronidazole . He was treated with antibiotics, and he reported improvement in his symptoms, but continues to experience discomfort, particularly when unable to pass gas.  He  describes himself as 'a real gassy person' and notes that holding in gas exacerbates his discomfort.  He experiences bright red rectal bleeding, which he attributes to hemorrhoids, and has been present since his mid-twenties. No dark blood in his stool.  He has not been taking any medication for constipation, as previous attempts with laxatives have caused more discomfort initially. He finds some relief with Tums for gas-related discomfort and heartburn, which he experiences particularly after eating larger meals or when wearing restrictive clothing at work. He reports a routine of daily bowel movements, typically starting with hard stools followed by softer ones, but notes difficulty in passing stools later in the day.  He has attempted dietary modifications, avoiding nuts and dairy, which he believes exacerbate his symptoms. He also takes ibuprofen  frequently for back pain and headaches, which he feels may contribute to his gastrointestinal symptoms.  His father has a history of similar gastrointestinal issues, which he believes may be hereditary. He works as a emergency planning/management officer, which involves wearing restrictive clothing that he feels contributes to his gastrointestinal discomfort.  History of left inguinal hernia repair in 2003.  Right inguinal hernia repair in 2009.  Social History: He is a emergency planning/management officer for [name].  Also served in the [employer].  Past Medical History:  Diagnosis Date   Diverticulitis    Tinnitus    Bilat Occ     Past Surgical History:  Procedure Laterality Date   HERNIA REPAIR Right 2009   INGUINAL HERNIA REPAIR Left 2003   SHOULDER ARTHROSCOPY WITH LABRAL REPAIR  01/03/2022   posterior  WISDOM TOOTH EXTRACTION Bilateral 2005   All four removed    Prior to Admission medications   Medication Sig Start Date End Date Taking? Authorizing Provider  anastrozole (ARIMIDEX) 1 MG tablet Take 1 mg by mouth once a week. 02/11/24   [provider]   ciprofloxacin  (CIPRO ) 500 MG tablet Take 1 tablet (500 mg total) by mouth 2 (two) times daily. 03/30/24   George Tanda POUR, PA-C  rosuvastatin  (CRESTOR ) 40 MG tablet Take 1 tablet (40 mg total) by mouth daily. 11/11/23   George Tanda POUR, PA-C    Family History  Problem Relation Age of Onset   Cancer Mother    Diabetes Father    Hypertension Father    Diverticulitis Father    Cancer Maternal Grandfather    Heart disease Paternal Grandfather      Social History   Tobacco Use   Smoking status: Light Smoker    Types: E-cigarettes   Smokeless tobacco: Former    Types: Snuff   Tobacco comments:    States cut out the cigarettes  Substance Use Topics   Alcohol use: Yes    Allergies as of 07/13/2024   (No Known Allergies)    Review of Systems:    All systems reviewed and negative except where noted in HPI.   Physical Exam:  BP 130/64   Pulse 94   Ht 5' 11 (1.803 m)   Wt 215 lb 6 oz (97.7 kg)   BMI 30.04 kg/m  No LMP for male patient.  General:   Alert,  Well-developed, well-nourished, pleasant and cooperative in NAD Lungs:  Respirations even and unlabored.  Clear throughout to auscultation.   No wheezes, crackles, or rhonchi. No acute distress. Heart:  Regular rate and rhythm; no murmurs, clicks, rubs, or gallops. Abdomen:  Normal bowel sounds.  No bruits.  Soft, and non-distended without masses, hepatosplenomegaly or hernias noted.  Moderate LLQ Tenderness.  Rest of abdomen is not tender.  No guarding or rebound tenderness.    Neurologic:  Alert and oriented x3;  grossly normal neurologically. Psych:  Alert and cooperative. Normal mood and affect.   Imaging Studies: No results found.  Labs: CBC    Component Value Date/Time   WBC 4.4 11/04/2023 1512   RBC 5.58 11/04/2023 1512   HGB 14.6 11/04/2023 1512   HCT 44.5 11/04/2023 1512   PLT 330 11/04/2023 1512   MCV 80 11/04/2023 1512    CMP     Component Value Date/Time   NA 139 11/04/2023 1512   K 4.3  11/04/2023 1512   CL 102 11/04/2023 1512   GLUCOSE 92 11/04/2023 1512   BUN 21 (H) 11/04/2023 1512   CREATININE 1.08 11/04/2023 1512   CALCIUM  9.4 11/04/2023 1512   PROT 6.5 11/04/2023 1512   ALBUMIN 4.3 11/04/2023 1512   AST 26 11/04/2023 1512   ALT 26 11/04/2023 1512   ALKPHOS 91 11/04/2023 1512   BILITOT 0.6 11/04/2023 1512   GFRNONAA 86 01/17/2020 1328   GFRAA 100 01/17/2020 1328    Assessment and Plan:   George Wong is a 38 y.o. y/o male has been referred for: Assessment & Plan Diverticulosis of sigmoid colon with history of diverticulitis. Diverticulosis with diverticulitis in the sigmoid colon. Persistent LLQ tenderness suggests ongoing diverticulitis or scar tissue. Risk of colon perforation if colonoscopy is performed too soon after infection. - Lab: CBC, CMP - Ordered repeat CT scan to evaluate for ongoing diverticulitis infection. - If diverticulitis is present, will  initiate antibiotic treatment. - If no diverticulitis, will schedule colonoscopy to evaluate for polyps or precancerous lesions.  2. Chronic constipation Hard stools and straining, exacerbated by ibuprofen  use. Previous treatments ineffective. Miralax recommended to soften stools and ease passage through potentially narrowed areas due to scar tissue. - Initiated Miralax, one capful in a drink once daily, preferably with breakfast. - Adjust Miralax dosage if stools become too loose.  3. Irritable bowel syndrome Symptoms of abdominal discomfort, bloating, and gas, exacerbated by dietary factors and stress. IBS symptoms include both constipation and occasional diarrhea, with constipation being more typical. - Start Miralax 1 capful daily.  4. Rectal bleeding due to hemorrhoids Chronic rectal bleeding for years; likely due to hemorrhoids. Bright red blood on tissue and in the toilet.  - Lab: CBC - Check for Anemia - Ordered blood work to check hemoglobin levels for anemia. - If CT is Negative for  Diverticulitis, then I will schedule Colonoscopy  5.  Gastroesophageal reflux disease without esophagitis GERD symptoms include heartburn and discomfort, particularly after eating large meals or wearing restrictive clothing. Managed with dietary modifications and Tums. - Started Pepcid (famotidine) 40 mg once daily to manage symptoms of indigestion and acid reflux.   Follow up in 4-6 weeks.  George Console, PA-C

## 2024-07-13 ENCOUNTER — Encounter: Payer: Self-pay | Admitting: Physician Assistant

## 2024-07-13 ENCOUNTER — Other Ambulatory Visit (INDEPENDENT_AMBULATORY_CARE_PROVIDER_SITE_OTHER)

## 2024-07-13 ENCOUNTER — Ambulatory Visit: Admitting: Physician Assistant

## 2024-07-13 ENCOUNTER — Ambulatory Visit: Payer: Self-pay | Admitting: Physician Assistant

## 2024-07-13 VITALS — BP 130/64 | HR 94 | Ht 71.0 in | Wt 215.4 lb

## 2024-07-13 DIAGNOSIS — R1032 Left lower quadrant pain: Secondary | ICD-10-CM | POA: Diagnosis not present

## 2024-07-13 DIAGNOSIS — K5732 Diverticulitis of large intestine without perforation or abscess without bleeding: Secondary | ICD-10-CM | POA: Diagnosis not present

## 2024-07-13 DIAGNOSIS — K625 Hemorrhage of anus and rectum: Secondary | ICD-10-CM | POA: Diagnosis not present

## 2024-07-13 DIAGNOSIS — K649 Unspecified hemorrhoids: Secondary | ICD-10-CM

## 2024-07-13 DIAGNOSIS — K5900 Constipation, unspecified: Secondary | ICD-10-CM

## 2024-07-13 DIAGNOSIS — K219 Gastro-esophageal reflux disease without esophagitis: Secondary | ICD-10-CM | POA: Diagnosis not present

## 2024-07-13 LAB — COMPREHENSIVE METABOLIC PANEL WITH GFR
ALT: 27 U/L (ref 0–53)
AST: 24 U/L (ref 0–37)
Albumin: 4.6 g/dL (ref 3.5–5.2)
Alkaline Phosphatase: 65 U/L (ref 39–117)
BUN: 22 mg/dL (ref 6–23)
CO2: 31 meq/L (ref 19–32)
Calcium: 9.4 mg/dL (ref 8.4–10.5)
Chloride: 102 meq/L (ref 96–112)
Creatinine, Ser: 1.11 mg/dL (ref 0.40–1.50)
GFR: 84.15 mL/min (ref >60.00)
Glucose, Bld: 83 mg/dL (ref 70–99)
Potassium: 4.6 meq/L (ref 3.5–5.1)
Sodium: 139 meq/L (ref 135–145)
Total Bilirubin: 0.6 mg/dL (ref 0.2–1.2)
Total Protein: 7.3 g/dL (ref 6.0–8.3)

## 2024-07-13 LAB — CBC WITH DIFFERENTIAL/PLATELET
Basophils Absolute: 0 K/uL (ref 0.0–0.1)
Basophils Relative: 0.3 % (ref 0.0–3.0)
Eosinophils Absolute: 0.1 K/uL (ref 0.0–0.7)
Eosinophils Relative: 1.9 % (ref 0.0–5.0)
HCT: 45.8 % (ref 39.0–52.0)
Hemoglobin: 15.4 g/dL (ref 13.0–17.0)
Lymphocytes Relative: 25.6 % (ref 12.0–46.0)
Lymphs Abs: 1.4 K/uL (ref 0.7–4.0)
MCHC: 33.6 g/dL (ref 30.0–36.0)
MCV: 85.3 fl (ref 78.0–100.0)
Monocytes Absolute: 0.5 K/uL (ref 0.1–1.0)
Monocytes Relative: 9.9 % (ref 3.0–12.0)
Neutro Abs: 3.3 K/uL (ref 1.4–7.7)
Neutrophils Relative %: 62.3 % (ref 43.0–77.0)
Platelets: 252 K/uL (ref 150.0–400.0)
RBC: 5.38 Mil/uL (ref 4.22–5.81)
RDW: 13.5 % (ref 11.5–15.5)
WBC: 5.4 K/uL (ref 4.0–10.5)

## 2024-07-13 MED ORDER — FAMOTIDINE 40 MG PO TABS: 40.0000 mg | ORAL_TABLET | Freq: Every day | ORAL | 3 refills | Status: AC

## 2024-07-13 NOTE — Progress Notes (Signed)
 Noted

## 2024-07-13 NOTE — Patient Instructions (Signed)
 For constipation: Start OTC Miralax Powder Mix 1 capful in 6 to 8 ounces of a drink once daily  Recommend high-fiber diet, 30 g of fiber daily Eat fruits, vegetables, and whole grains Drink 64 ounces of water / fluids daily.   _______________________________________________________  If your blood pressure at your visit was 140/90 or greater, please contact your primary care physician to follow up on this.  _______________________________________________________  If you are age 81 or older, your body mass index should be between 23-30. Your Body mass index is 30.04 kg/m. If this is out of the aforementioned range listed, please consider follow up with your Primary Care Provider.  If you are age 81 or younger, your body mass index should be between 19-25. Your Body mass index is 30.04 kg/m. If this is out of the aformentioned range listed, please consider follow up with your Primary Care Provider.   ________________________________________________________  The [facility] providers would like to encourage you to use MYCHART to communicate with providers for non-urgent requests or questions.  Due to long hold times on the telephone, sending your provider a message by [facility] may be a faster and more efficient way to get a response.  Please allow 48 business hours for a response.  Please remember that this is for non-urgent requests.  _______________________________________________________  [facility] is using a team-based approach to care.  Your team is made up of your doctor and two to three APPS. Our APPS (Nurse Practitioners and Physician Assistants) work with your physician to ensure care continuity for you. They are fully qualified to address your health concerns and develop a treatment plan. They communicate directly with your gastroenterologist to care for you. Seeing the Advanced Practice Practitioners on your physician's team can help you by facilitating care more  promptly, often allowing for earlier appointments, access to diagnostic testing, procedures, and other specialty referrals.   Your provider has requested that you go to the basement level for lab work before leaving today. Press B on the elevator. The lab is located at the first door on the left as you exit the elevator.  You have been scheduled for a CT scan of the abdomen and pelvis at [facility] [address] [place], [place], [place] [address]. You are scheduled on 07/19/2024 at 12 pm. You should arrive at 10:00 am for registration and to drink contrast.   1) Do not eat anything after 8:00 am (4 hours prior to your test)   You may take any medications as prescribed with a small amount of water, if necessary. If you take any of the following medications: METFORMIN, GLUCOPHAGE, GLUCOVANCE, AVANDAMET, RIOMET, FORTAMET, ACTOPLUS MET, JANUMET, GLUMETZA or METAGLIP, you MAY be asked to HOLD this medication 48 hours AFTER the exam.   The purpose of you drinking the oral contrast is to aid in the visualization of your intestinal tract. The contrast solution may cause some diarrhea. Depending on your individual set of symptoms, you may also receive an intravenous injection of x-ray contrast/dye. Plan on being at [facility] for 45 minutes or longer, depending on the type of exam you are having performed.   If you have any questions regarding your exam or if you need to reschedule, you may call [facility] at [phone] between the hours of 8:00 am and 5:00 pm, Monday-Friday.   Due to recent changes in healthcare laws, you may see the results of your imaging and laboratory studies on MyChart before your provider has  had a chance to review them.  We understand that in some cases there may be results that are confusing or concerning to you. Not all laboratory results come back in the same time frame and the provider may be waiting for multiple results in order to interpret  others.  Please give us  48 hours in order for your provider to thoroughly review all the results before contacting the office for clarification of your results.

## 2024-07-19 ENCOUNTER — Ambulatory Visit
Admission: RE | Admit: 2024-07-19 | Discharge: 2024-07-19 | Disposition: A | Discharge status: Home / Self Care | Source: Ambulatory Visit | Attending: Physician Assistant | Admitting: Physician Assistant

## 2024-07-19 DIAGNOSIS — R1032 Left lower quadrant pain: Secondary | ICD-10-CM | POA: Diagnosis not present

## 2024-07-19 DIAGNOSIS — K5732 Diverticulitis of large intestine without perforation or abscess without bleeding: Secondary | ICD-10-CM | POA: Insufficient documentation

## 2024-07-19 DIAGNOSIS — K5792 Diverticulitis of intestine, part unspecified, without perforation or abscess without bleeding: Secondary | ICD-10-CM | POA: Diagnosis not present

## 2024-07-19 MED ORDER — IOHEXOL 9 MG/ML PO SOLN
500.0000 mL | ORAL | Status: AC
  Administered 2024-07-19: 500 mL via ORAL

## 2024-07-19 MED ORDER — IOHEXOL 300 MG/ML  SOLN
100.0000 mL | Freq: Once | INTRAMUSCULAR | Status: AC | PRN
  Administered 2024-07-19: 100 mL via INTRAVENOUS

## 2024-07-24 NOTE — Progress Notes (Signed)
 Abdominal pelvic CT shows no acute abnormality.  There is no evidence of current diverticulitis.   Previous diverticulitis has resolved.He had previous left inguinal hernia repair surgery.  No evidence of recurrent hernia or masses.   Please go ahead and schedule Colonoscopy in [facility] to further evaluate rectal bleeding.  Ellouise Console, PA-C

## 2024-07-25 MED ORDER — NA SULFATE-K SULFATE-MG SULF 17.5-3.13-1.6 GM/177ML PO SOLN: ORAL | 0 refills | Status: AC

## 2024-08-03 ENCOUNTER — Telehealth: Payer: Self-pay | Admitting: Internal Medicine

## 2024-08-03 NOTE — Telephone Encounter (Signed)
 Patient states that he ruptured his achillis tendon and will likely require surgery. Patient says he would like to call back to reschedule his colonoscopy once he finds out when his surgery will be scheduled for.

## 2024-08-03 NOTE — Telephone Encounter (Signed)
 PT is scheduled for a colonoscopy on 12/15 and just recently popped his achilles and cannot walk. He is also on some new medications as well and would like to discuss would this affect his procedure. Please advise.

## 2024-08-05 ENCOUNTER — Other Ambulatory Visit: Payer: Self-pay

## 2024-08-05 NOTE — Progress Notes (Signed)
 SABRA

## 2024-08-08 ENCOUNTER — Encounter: Admitting: Internal Medicine

## 2024-08-30 ENCOUNTER — Ambulatory Visit: Admitting: Physician Assistant

## 2024-08-30 NOTE — Progress Notes (Deleted)
 "     George Console, PA-C [address] [place], [place]  [address] Phone: [phone]   Primary Care Physician: Claudene Tanda POUR, PA-C  Primary Gastroenterologist:  George Console, PA-C / ***  Chief Complaint: Follow-up LLQ pain and diverticulitis       HPI:   Discussed the use of AI scribe software for clinical note transcription with the patient, who gave verbal consent to proceed.  History of Present Illness      Current Outpatient Medications  Medication Sig Dispense Refill   anastrozole (ARIMIDEX) 1 MG tablet Take 1 mg by mouth once a week.     ciprofloxacin  (CIPRO ) 500 MG tablet Take 1 tablet (500 mg total) by mouth 2 (two) times daily. (Patient not taking: Reported on 07/13/2024) 14 tablet 0   famotidine  (PEPCID ) 40 MG tablet Take 1 tablet (40 mg total) by mouth daily. 90 tablet 3   HYDROcodone-acetaminophen (NORCO/VICODIN) 5-325 MG tablet Take 1 tablet every 4 hours by oral route as needed, for pain.     lisdexamfetamine (VYVANSE) 30 MG capsule      Na Sulfate-K Sulfate-Mg Sulfate concentrate (SUPREP) 17.5-3.13-1.6 GM/177ML SOLN Use as directed; may use generic; goodrx card if insurance will not cover generic 354 mL 0   rosuvastatin  (CRESTOR ) 40 MG tablet Take 1 tablet (40 mg total) by mouth daily. 90 tablet 3   No current facility-administered medications for this visit.    Allergies as of 08/30/2024   (No Known Allergies)    Past Medical History:  Diagnosis Date   Diverticulitis    Tinnitus    Bilat Occ     Past Surgical History:  Procedure Laterality Date   HERNIA REPAIR Right 2009   INGUINAL HERNIA REPAIR Left 2003   SHOULDER ARTHROSCOPY WITH LABRAL REPAIR  01/03/2022   posterior   WISDOM TOOTH EXTRACTION Bilateral 2005   All four removed    Review of Systems:    All systems reviewed and negative except where noted in HPI.    Physical Exam:  There were no vitals taken for this visit. No LMP for male patient.  General: Well-nourished,  well-developed in no acute distress.  Lungs: Clear to auscultation bilaterally. Non-labored. Heart: Regular rate and rhythm, no murmurs rubs or gallops.  Abdomen: Bowel sounds are normal; Abdomen is Soft; No hepatosplenomegaly, masses or hernias;  No Abdominal Tenderness; No guarding or rebound tenderness. Neuro: Alert and oriented x 3.  Grossly intact.  Psych: Alert and cooperative, normal mood and affect.   Imaging Studies: No results found.  Labs: CBC    Component Value Date/Time   WBC 5.4 07/13/2024 1112   RBC 5.38 07/13/2024 1112   HGB 15.4 07/13/2024 1112   HGB 14.6 11/04/2023 1512   HCT 45.8 07/13/2024 1112   HCT 44.5 11/04/2023 1512   PLT 252.0 07/13/2024 1112   PLT 330 11/04/2023 1512   MCV 85.3 07/13/2024 1112   MCV 80 11/04/2023 1512   MCH 26.2 (L) 11/04/2023 1512   MCHC 33.6 07/13/2024 1112   RDW 13.5 07/13/2024 1112   RDW 16.7 (H) 11/04/2023 1512   LYMPHSABS 1.4 07/13/2024 1112   LYMPHSABS 1.2 11/04/2023 1512   MONOABS 0.5 07/13/2024 1112   EOSABS 0.1 07/13/2024 1112   EOSABS 0.2 11/04/2023 1512   BASOSABS 0.0 07/13/2024 1112   BASOSABS 0.0 11/04/2023 1512    CMP     Component Value Date/Time   NA 139 07/13/2024 1112   NA 139 11/04/2023 1512   K  4.6 07/13/2024 1112   CL 102 07/13/2024 1112   CO2 31 07/13/2024 1112   GLUCOSE 83 07/13/2024 1112   BUN 22 07/13/2024 1112   BUN 21 (H) 11/04/2023 1512   CREATININE 1.11 07/13/2024 1112   CALCIUM  9.4 07/13/2024 1112   PROT 7.3 07/13/2024 1112   PROT 6.5 11/04/2023 1512   ALBUMIN 4.6 07/13/2024 1112   ALBUMIN 4.3 11/04/2023 1512   AST 24 07/13/2024 1112   ALT 27 07/13/2024 1112   ALKPHOS 65 07/13/2024 1112   BILITOT 0.6 07/13/2024 1112   BILITOT 0.6 11/04/2023 1512   GFRNONAA 86 01/17/2020 1328   GFRAA 100 01/17/2020 1328       Assessment and Plan:   George Wong is a 39 y.o. y/o male ***  Assessment and Plan Assessment & Plan       George Console, PA-C  Follow up ***   "

## 2024-11-02 ENCOUNTER — Encounter: Payer: Self-pay | Admitting: Physician Assistant
